# Patient Record
Sex: Female | Born: 1960 | Race: White | Hispanic: No | Marital: Married | Smoking: Never smoker
Health system: Southern US, Community
[De-identification: ages and names within clinical notes are randomized; demographics above are authoritative.]

## PROBLEM LIST (undated history)

## (undated) DIAGNOSIS — Z8619 Personal history of other infectious and parasitic diseases: Secondary | ICD-10-CM

## (undated) DIAGNOSIS — R7303 Prediabetes: Secondary | ICD-10-CM

## (undated) DIAGNOSIS — T7840XA Allergy, unspecified, initial encounter: Secondary | ICD-10-CM

## (undated) DIAGNOSIS — E079 Disorder of thyroid, unspecified: Secondary | ICD-10-CM

## (undated) DIAGNOSIS — I10 Essential (primary) hypertension: Secondary | ICD-10-CM

## (undated) HISTORY — PX: BREAST BIOPSY: SHX20

## (undated) HISTORY — DX: Prediabetes: R73.03

## (undated) HISTORY — DX: Disorder of thyroid, unspecified: E07.9

## (undated) HISTORY — DX: Personal history of other infectious and parasitic diseases: Z86.19

## (undated) HISTORY — DX: Allergy, unspecified, initial encounter: T78.40XA

## (undated) HISTORY — DX: Essential (primary) hypertension: I10

---

## 2003-05-04 HISTORY — PX: TOTAL ABDOMINAL HYSTERECTOMY: SHX209

## 2005-04-01 ENCOUNTER — Ambulatory Visit: Payer: Self-pay | Admitting: Unknown Physician Specialty

## 2005-04-20 ENCOUNTER — Ambulatory Visit: Payer: Self-pay | Admitting: Unknown Physician Specialty

## 2005-11-01 ENCOUNTER — Ambulatory Visit: Payer: Self-pay | Admitting: General Surgery

## 2006-04-14 ENCOUNTER — Ambulatory Visit: Payer: Self-pay | Admitting: Unknown Physician Specialty

## 2007-06-07 ENCOUNTER — Ambulatory Visit: Payer: Self-pay | Admitting: Unknown Physician Specialty

## 2007-06-12 ENCOUNTER — Ambulatory Visit: Payer: Self-pay | Admitting: Unknown Physician Specialty

## 2007-12-15 ENCOUNTER — Ambulatory Visit: Payer: Self-pay | Admitting: General Surgery

## 2008-06-18 ENCOUNTER — Ambulatory Visit: Payer: Self-pay | Admitting: General Surgery

## 2009-07-03 ENCOUNTER — Ambulatory Visit: Payer: Self-pay | Admitting: Unknown Physician Specialty

## 2010-07-08 ENCOUNTER — Ambulatory Visit: Payer: Self-pay | Admitting: Family Medicine

## 2011-07-13 ENCOUNTER — Ambulatory Visit: Payer: Self-pay | Admitting: Family Medicine

## 2012-03-29 ENCOUNTER — Ambulatory Visit: Payer: Self-pay | Admitting: General Practice

## 2012-07-25 ENCOUNTER — Ambulatory Visit: Payer: Self-pay | Admitting: Family Medicine

## 2013-08-08 ENCOUNTER — Ambulatory Visit: Payer: Self-pay | Admitting: Family Medicine

## 2013-08-28 ENCOUNTER — Ambulatory Visit: Payer: Self-pay | Admitting: Family Medicine

## 2014-07-26 LAB — BASIC METABOLIC PANEL
BUN: 10 mg/dL (ref 4–21)
CREATININE: 0.8 mg/dL (ref 0.5–1.1)
Potassium: 3.4 mmol/L (ref 3.4–5.3)
Sodium: 142 mmol/L (ref 137–147)

## 2014-07-26 LAB — HEPATIC FUNCTION PANEL
ALK PHOS: 16 U/L — AB (ref 25–125)
ALT: 28 U/L (ref 7–35)
AST: 24 U/L (ref 13–35)
BILIRUBIN, TOTAL: 0.5 mg/dL

## 2014-07-26 LAB — CBC AND DIFFERENTIAL
HEMATOCRIT: 42 % (ref 36–46)
Hemoglobin: 13.6 g/dL (ref 12.0–16.0)
NEUTROS ABS: 3 /uL
PLATELETS: 260 10*3/uL (ref 150–399)
WBC: 6 10*3/mL

## 2014-07-26 LAB — LIPID PANEL
Cholesterol: 156 mg/dL (ref 0–200)
HDL: 46 mg/dL (ref 35–70)
LDL CALC: 88 mg/dL
TRIGLYCERIDES: 112 mg/dL (ref 40–160)

## 2014-07-26 LAB — TSH: TSH: 3.69 u[IU]/mL (ref 0.41–5.90)

## 2014-07-26 LAB — HEMOGLOBIN A1C: HEMOGLOBIN A1C: 5.9

## 2014-09-23 ENCOUNTER — Other Ambulatory Visit: Payer: Self-pay | Admitting: Unknown Physician Specialty

## 2014-09-23 DIAGNOSIS — N63 Unspecified lump in unspecified breast: Secondary | ICD-10-CM

## 2014-10-02 ENCOUNTER — Ambulatory Visit
Admission: RE | Admit: 2014-10-02 | Discharge: 2014-10-02 | Disposition: A | Discharge status: Home / Self Care | Payer: BLUE CROSS/BLUE SHIELD | Source: Ambulatory Visit | Attending: Unknown Physician Specialty | Admitting: Unknown Physician Specialty

## 2014-10-02 DIAGNOSIS — N63 Unspecified lump in unspecified breast: Secondary | ICD-10-CM

## 2014-10-02 DIAGNOSIS — R922 Inconclusive mammogram: Secondary | ICD-10-CM | POA: Diagnosis not present

## 2015-05-20 ENCOUNTER — Ambulatory Visit: Payer: BLUE CROSS/BLUE SHIELD | Admitting: Nurse Practitioner

## 2015-05-22 ENCOUNTER — Ambulatory Visit (INDEPENDENT_AMBULATORY_CARE_PROVIDER_SITE_OTHER): Payer: BLUE CROSS/BLUE SHIELD | Admitting: Nurse Practitioner

## 2015-05-22 ENCOUNTER — Encounter: Payer: Self-pay | Admitting: Nurse Practitioner

## 2015-05-22 VITALS — BP 122/78 | HR 82 | Temp 98.9°F | Resp 16 | Ht 65.0 in | Wt 156.0 lb

## 2015-05-22 DIAGNOSIS — N952 Postmenopausal atrophic vaginitis: Secondary | ICD-10-CM

## 2015-05-22 DIAGNOSIS — Z7189 Other specified counseling: Secondary | ICD-10-CM | POA: Diagnosis not present

## 2015-05-22 DIAGNOSIS — Z7689 Persons encountering health services in other specified circumstances: Secondary | ICD-10-CM

## 2015-05-22 MED ORDER — ESTROGENS, CONJUGATED 0.625 MG/GM VA CREA: 1.0000 | TOPICAL_CREAM | VAGINAL | Status: DC

## 2015-05-22 NOTE — Progress Notes (Signed)
Patient ID: [name], female    DOB: Feb 15, 1961  Age: 55 y.o. MRN: [id number]  CC: No chief complaint on file.   HPI Latasha Lopez presents for establishing care and   1) New Pt Info:   Immunizations-  Flu UTD, unsure about last tdap   Mammogram- 10/02/2014   Pap- Hysterectomy   Colonoscopy- Needs this year  Will need mammogram in March at [facility]:  Need for refill on estrace- but would like to see if something else is cheaper.   Willing to try Premarin, has not tried others in past  Helpful for vaginal dryness during intercourse   History Latasha Lopez has a past medical history of Allergy; History of chicken pox; Hypertension; and Thyroid disease.   She has past surgical history that includes Breast biopsy (Left, [phone]) and Total abdominal hysterectomy (2005).   Her family history includes Breast cancer (age of onset: 79) in her mother; Diabetes in her paternal aunt; Fibroids in her mother and sister; Hypertension in her father and mother.She reports that she has never smoked. She does not have any smokeless tobacco history on file. She reports that she does not drink alcohol or use illicit drugs.  No outpatient prescriptions prior to visit.   No facility-administered medications prior to visit.    ROS Review of Systems  Constitutional: Negative for fever, chills, diaphoresis and fatigue.  Respiratory: Negative for chest tightness, shortness of breath and wheezing.   Cardiovascular: Negative for chest pain, palpitations and leg swelling.  Gastrointestinal: Negative for nausea, vomiting and diarrhea.  Skin: Negative for rash.  Neurological: Negative for dizziness, weakness, numbness and headaches.  Psychiatric/Behavioral: The patient is not nervous/anxious.    Objective:  BP 122/78 mmHg  Pulse 82  Temp(Src) 98.9 F (37.2 C)  Resp 16  Ht   Wt   SpO2 98%  Physical Exam  Constitutional: She is oriented to person, place, and time. She appears well-developed  and well-nourished. No distress.  HENT:  Head: Normocephalic and atraumatic.  Right Ear: External ear normal.  Left Ear: External ear normal.  Cardiovascular: Normal rate, regular rhythm and normal heart sounds.  Exam reveals no gallop and no friction rub.   No murmur heard. Pulmonary/Chest: Effort normal and breath sounds normal. No respiratory distress. She has no wheezes. She has no rales. She exhibits no tenderness.  Neurological: She is alert and oriented to person, place, and time. No cranial nerve deficit. She exhibits normal muscle tone. Coordination normal.  Skin: Skin is warm and dry. No rash noted. She is not diaphoretic.  Psychiatric: She has a normal mood and affect. Her behavior is normal. Judgment and thought content normal.   Assessment & Plan:   Diagnoses and all orders for this visit:  Encounter to establish care  Other orders -     conjugated estrogens (PREMARIN) vaginal cream; Place 1 Applicatorful vaginally once a week.   I am having Latasha Lopez start on conjugated estrogens. I am also having her maintain her hydrochlorothiazide, levothyroxine, estradiol, cholecalciferol, multivitamin with minerals, fexofenadine, and omega-3 acid ethyl esters.  Meds ordered this encounter  Medications  . hydrochlorothiazide (HYDRODIURIL) 25 MG tablet    Sig: Take 25 mg by mouth daily.  Marland Kitchen levothyroxine (SYNTHROID, LEVOTHROID) 25 MCG tablet    Sig: Take 25 mcg by mouth daily before breakfast.  . estradiol (ESTRACE) 0.1 MG/GM vaginal cream    Sig: Place 1 Applicatorful vaginally at bedtime.  . cholecalciferol (VITAMIN D) 1000 units tablet  Sig: Take 1,000 Units by mouth daily.  . Multiple Vitamins-Minerals (MULTIVITAMIN WITH MINERALS) tablet    Sig: Take 1 tablet by mouth daily.  . fexofenadine (ALLEGRA) 60 MG tablet    Sig: Take 60 mg by mouth 2 (two) times daily.  Marland Kitchen omega-3 acid ethyl esters (LOVAZA) 1 g capsule    Sig: Take by mouth 2 (two) times daily.  Marland Kitchen conjugated  estrogens (PREMARIN) vaginal cream    Sig: Place 1 Applicatorful vaginally once a week.    Dispense:  30 g    Refill:  1    Order Specific Question:  Supervising Provider    Answer:  Crecencio Mc [[id number]]     Follow-up: Return in about 2 months (around 07/20/2015) for CPE .

## 2015-05-22 NOTE — Progress Notes (Signed)
Pre visit review using our clinic review tool, if applicable. No additional management support is needed unless otherwise documented below in the visit note. 

## 2015-05-22 NOTE — Patient Instructions (Signed)
See you in March!   Welcome to [facility]!

## 2015-05-26 ENCOUNTER — Encounter: Payer: Self-pay | Admitting: Nurse Practitioner

## 2015-05-26 DIAGNOSIS — Z7689 Persons encountering health services in other specified circumstances: Secondary | ICD-10-CM | POA: Insufficient documentation

## 2015-05-26 NOTE — Assessment & Plan Note (Signed)
Discussed acute and chronic issues. Reviewed health maintenance measures, PFSHx, and immunizations. Obtain records from previous facilities- Dr. Ammie Dalton [facility]

## 2015-05-28 ENCOUNTER — Encounter: Payer: Self-pay | Admitting: Nurse Practitioner

## 2015-05-28 DIAGNOSIS — N952 Postmenopausal atrophic vaginitis: Secondary | ICD-10-CM | POA: Insufficient documentation

## 2015-05-28 NOTE — Assessment & Plan Note (Signed)
New Problem Pt was seen in past by Dr. Ammie Dalton and was using Estrace Finds it expensive Will try Premarin. Asked her to call if more expensive than Estrace Sent to pharmacy via e[facility] Will follow up in March

## 2015-05-30 ENCOUNTER — Encounter: Payer: Self-pay | Admitting: Nurse Practitioner

## 2015-06-26 ENCOUNTER — Encounter: Payer: Self-pay | Admitting: Nurse Practitioner

## 2015-06-26 ENCOUNTER — Other Ambulatory Visit: Payer: Self-pay

## 2015-06-26 MED ORDER — LEVOTHYROXINE SODIUM 25 MCG PO TABS: 25.0000 ug | ORAL_TABLET | Freq: Every day | ORAL | Status: DC

## 2015-06-26 MED ORDER — HYDROCHLOROTHIAZIDE 25 MG PO TABS: 25.0000 mg | ORAL_TABLET | Freq: Every day | ORAL | Status: DC

## 2015-06-26 MED ORDER — HYDROCHLOROTHIAZIDE 25 MG PO TABS
25.0000 mg | ORAL_TABLET | Freq: Every day | ORAL | Status: DC
Start: 2015-06-26 — End: 2015-06-26

## 2015-06-26 NOTE — Addendum Note (Signed)
Addended by: Bevelyn Ngo on: 06/26/2015 10:29 AM   Modules accepted: Orders

## 2015-07-28 ENCOUNTER — Encounter: Payer: Self-pay | Admitting: Nurse Practitioner

## 2015-08-04 ENCOUNTER — Encounter: Payer: Self-pay | Admitting: Nurse Practitioner

## 2015-08-05 LAB — HEPATIC FUNCTION PANEL
ALT: 19 U/L (ref 7–35)
AST: 22 U/L (ref 13–35)
Alkaline Phosphatase: 91 U/L (ref 25–125)
Bilirubin, Total: 0.4 mg/dL

## 2015-08-05 LAB — TSH: TSH: 2.37 u[IU]/mL (ref 0.41–5.90)

## 2015-08-05 LAB — BASIC METABOLIC PANEL
BUN: 13 mg/dL (ref 4–21)
Creatinine: 0.8 mg/dL (ref 0.5–1.1)
POTASSIUM: 3.6 mmol/L (ref 3.4–5.3)
Sodium: 140 mmol/L (ref 137–147)

## 2015-08-05 LAB — CBC AND DIFFERENTIAL
HEMATOCRIT: 39 % (ref 36–46)
HEMOGLOBIN: 12.8 g/dL (ref 12.0–16.0)
NEUTROS ABS: 50 /uL
PLATELETS: 291 10*3/uL (ref 150–399)
WBC: 5.5 10^3/mL

## 2015-08-05 LAB — LIPID PANEL
CHOLESTEROL: 136 mg/dL (ref 0–200)
HDL: 45 mg/dL (ref 35–70)
LDL Cholesterol: 71 mg/dL
Triglycerides: 98 mg/dL (ref 40–160)

## 2015-08-08 ENCOUNTER — Encounter: Payer: Self-pay | Admitting: Nurse Practitioner

## 2015-08-15 LAB — HEMOGLOBIN A1C: Hemoglobin A1C: 6.2

## 2015-08-15 LAB — HEPATIC FUNCTION PANEL
ALT: 19 U/L (ref 7–35)
AST: 22 U/L (ref 13–35)
Alkaline Phosphatase: 91 U/L (ref 25–125)
BILIRUBIN, TOTAL: 0.4 mg/dL

## 2015-08-15 LAB — CBC AND DIFFERENTIAL
HCT: 39 % (ref 36–46)
HEMOGLOBIN: 12.8 g/dL (ref 12.0–16.0)
Neutrophils Absolute: 3 /uL
Platelets: 291 10*3/uL (ref 150–399)
WBC: 5.5 10^3/mL

## 2015-08-15 LAB — BASIC METABOLIC PANEL
BUN: 13 mg/dL (ref 4–21)
CREATININE: 0.8 mg/dL (ref 0.5–1.1)
Glucose: 111 mg/dL
POTASSIUM: 3.6 mmol/L (ref 3.4–5.3)
SODIUM: 140 mmol/L (ref 137–147)

## 2015-08-15 LAB — LIPID PANEL
Cholesterol: 136 mg/dL (ref 0–200)
HDL: 45 mg/dL (ref 35–70)
LDL Cholesterol: 71 mg/dL
LDL/HDL RATIO: 3
TRIGLYCERIDES: 98 mg/dL (ref 40–160)

## 2015-08-15 LAB — TSH: TSH: 2.37 u[IU]/mL (ref 0.41–5.90)

## 2015-08-19 ENCOUNTER — Encounter: Payer: Self-pay | Admitting: Nurse Practitioner

## 2015-08-21 ENCOUNTER — Ambulatory Visit (INDEPENDENT_AMBULATORY_CARE_PROVIDER_SITE_OTHER): Payer: BLUE CROSS/BLUE SHIELD | Admitting: Nurse Practitioner

## 2015-08-21 ENCOUNTER — Encounter: Payer: Self-pay | Admitting: Nurse Practitioner

## 2015-08-21 VITALS — BP 137/81 | HR 77 | Temp 98.0°F | Ht 61.75 in | Wt 188.0 lb

## 2015-08-21 DIAGNOSIS — Z0000 Encounter for general adult medical examination without abnormal findings: Secondary | ICD-10-CM

## 2015-08-21 DIAGNOSIS — Z1239 Encounter for other screening for malignant neoplasm of breast: Secondary | ICD-10-CM | POA: Diagnosis not present

## 2015-08-21 MED ORDER — LEVOTHYROXINE SODIUM 25 MCG PO TABS: 25.0000 ug | ORAL_TABLET | Freq: Every day | ORAL | Status: DC

## 2015-08-21 MED ORDER — HYDROCHLOROTHIAZIDE 25 MG PO TABS: 25.0000 mg | ORAL_TABLET | Freq: Every day | ORAL | Status: DC

## 2015-08-21 NOTE — Progress Notes (Signed)
Pre-visit discussion using our clinic review tool. No additional management support is needed unless otherwise documented below in the visit note.  

## 2015-08-21 NOTE — Patient Instructions (Signed)

## 2015-08-21 NOTE — Progress Notes (Signed)
Patient ID: Latasha Lopez, female    DOB: 05-16-1960  Age: 55 y.o. MRN: [id number]  CC: Annual Exam   HPI Latasha Lopez presents for Annual Exam  1) Annual Physical   Diet- No formal   Exercise- No formal   Immunizations-    Tdap- Needs records from [place]- Ordered   Colonoscopy- Cologuard   Eye Exam- UTD, glasses  Dental Exam- UTD  LMP- Hysterectomy   Labs- Latasha Lopez   Depression- Negative   Refills: BP and thyroid    History Latasha Lopez has a past medical history of Allergy; History of chicken pox; Hypertension; and Thyroid disease.   She has past surgical history that includes Breast biopsy (Left, [phone]) and Total abdominal hysterectomy (2005).   Her family history includes Breast cancer (age of onset: 39) in her mother; Diabetes in her paternal aunt; Fibroids in her mother and sister; Hypertension in her father and mother.She reports that she has never smoked. She does not have any smokeless tobacco history on file. She reports that she does not drink alcohol or use illicit drugs.  Outpatient Prescriptions Prior to Visit  Medication Sig Dispense Refill  . cholecalciferol (VITAMIN D) 1000 units tablet Take 1,000 Units by mouth daily.    Marland Kitchen conjugated estrogens (PREMARIN) vaginal cream Place 1 Applicatorful vaginally once a week. 30 g 1  . fexofenadine (ALLEGRA) 60 MG tablet Take 60 mg by mouth 2 (two) times daily.    . Multiple Vitamins-Minerals (MULTIVITAMIN WITH MINERALS) tablet Take 1 tablet by mouth daily.    Marland Kitchen omega-3 acid ethyl esters (LOVAZA) 1 g capsule Take by mouth 2 (two) times daily.    . hydrochlorothiazide (HYDRODIURIL) 25 MG tablet Take 1 tablet (25 mg total) by mouth daily. 30 tablet 1  . levothyroxine (SYNTHROID, LEVOTHROID) 25 MCG tablet Take 1 tablet (25 mcg total) by mouth daily before breakfast. 30 tablet 1   No facility-administered medications prior to visit.    ROS Review of Systems  Constitutional: Negative for fever, chills, diaphoresis,  fatigue and unexpected weight change.  HENT: Negative for tinnitus and trouble swallowing.   Eyes: Negative for visual disturbance.  Respiratory: Negative for chest tightness, shortness of breath and wheezing.   Cardiovascular: Negative for chest pain, palpitations and leg swelling.  Gastrointestinal: Negative for nausea, vomiting, abdominal pain, diarrhea, constipation and blood in stool.  Endocrine: Negative for polydipsia, polyphagia and polyuria.  Genitourinary: Negative for dysuria, hematuria, vaginal discharge and vaginal pain.  Musculoskeletal: Negative for myalgias, back pain, arthralgias and gait problem.  Skin: Negative for color change and rash.  Neurological: Negative for dizziness, weakness, numbness and headaches.  Hematological: Does not bruise/bleed easily.  Psychiatric/Behavioral: Negative for suicidal ideas and sleep disturbance. The patient is not nervous/anxious.     Objective:  BP 137/81 mmHg  Pulse 77  Temp(Src) 98 F (36.7 C) (Oral)  Ht 5' 1.75" (1.568 m)  Wt 188 lb (85.276 kg)  BMI 34.68 kg/m2  SpO2 96%  Physical Exam  Constitutional: She is oriented to person, place, and time. She appears well-developed and well-nourished. No distress.  HENT:  Head: Normocephalic and atraumatic.  Right Ear: External ear normal.  Left Ear: External ear normal.  Nose: Nose normal.  Mouth/Throat: Oropharynx is clear and moist. No oropharyngeal exudate.  TMs and canals clear bilaterally  Eyes: Conjunctivae and EOM are normal. Pupils are equal, round, and reactive to light. Right eye exhibits no discharge. Left eye exhibits no discharge. No scleral icterus.  Neck: Normal  range of motion. Neck supple. No thyromegaly present.  Cardiovascular: Normal rate, regular rhythm, normal heart sounds and intact distal pulses.  Exam reveals no gallop and no friction rub.   No murmur heard. Pulmonary/Chest: Effort normal and breath sounds normal. No respiratory distress. She has no  wheezes. She has no rales. She exhibits no tenderness.  Clinical breast exam without findings today  Abdominal: Soft. Bowel sounds are normal. She exhibits no distension and no mass. There is no tenderness. There is no rebound and no guarding.  Genitourinary:  Deferred due to lack of concern and hysterectomy history  Musculoskeletal: Normal range of motion. She exhibits no edema or tenderness.  Lymphadenopathy:    She has no cervical adenopathy.  Neurological: She is alert and oriented to person, place, and time. She has normal reflexes. No cranial nerve deficit. She exhibits normal muscle tone. Coordination normal.  Skin: Skin is warm and dry. No rash noted. She is not diaphoretic. No erythema. No pallor.  Psychiatric: She has a normal mood and affect. Her behavior is normal. Judgment and thought content normal.   Assessment & Plan:   Olie was seen today for annual exam.  Diagnoses and all orders for this visit:  Screening breast examination -     MM DIGITAL SCREENING BILATERAL; Future  Routine general medical examination at a health care facility  Other orders -     hydrochlorothiazide (HYDRODIURIL) 25 MG tablet; Take 1 tablet (25 mg total) by mouth daily. -     levothyroxine (SYNTHROID, LEVOTHROID) 25 MCG tablet; Take 1 tablet (25 mcg total) by mouth daily before breakfast.   I am having Latasha Lopez maintain her cholecalciferol, multivitamin with minerals, fexofenadine, omega-3 acid ethyl esters, conjugated estrogens, hydrochlorothiazide, and levothyroxine.  Meds ordered this encounter  Medications  . hydrochlorothiazide (HYDRODIURIL) 25 MG tablet    Sig: Take 1 tablet (25 mg total) by mouth daily.    Dispense:  90 tablet    Refill:  3    Order Specific Question:  Supervising Provider    Answer:  Deborra Medina L [[id number]]  . levothyroxine (SYNTHROID, LEVOTHROID) 25 MCG tablet    Sig: Take 1 tablet (25 mcg total) by mouth daily before breakfast.    Dispense:  90 tablet     Refill:  3    Order Specific Question:  Supervising Provider    Answer:  Crecencio Mc [[id number]]     Follow-up: Return if symptoms worsen or fail to improve.

## 2015-08-22 ENCOUNTER — Encounter: Payer: Self-pay | Admitting: Nurse Practitioner

## 2015-08-28 ENCOUNTER — Other Ambulatory Visit: Payer: Self-pay | Admitting: Family Medicine

## 2015-08-31 DIAGNOSIS — Z1239 Encounter for other screening for malignant neoplasm of breast: Secondary | ICD-10-CM | POA: Insufficient documentation

## 2015-08-31 DIAGNOSIS — Z0000 Encounter for general adult medical examination without abnormal findings: Secondary | ICD-10-CM | POA: Insufficient documentation

## 2015-08-31 NOTE — Assessment & Plan Note (Addendum)
Discussed acute and chronic issues. Reviewed health maintenance measures, PFSHx, and immunizations.   Labs through [facility] ordered HM made UTD Ordered cologuard

## 2015-08-31 NOTE — Assessment & Plan Note (Signed)
Ordered mammogram.

## 2015-10-03 ENCOUNTER — Other Ambulatory Visit: Payer: Self-pay | Admitting: Nurse Practitioner

## 2015-10-03 ENCOUNTER — Ambulatory Visit
Admission: RE | Admit: 2015-10-03 | Discharge: 2015-10-03 | Disposition: A | Discharge status: Home / Self Care | Payer: BLUE CROSS/BLUE SHIELD | Source: Ambulatory Visit | Attending: Nurse Practitioner | Admitting: Nurse Practitioner

## 2015-10-03 ENCOUNTER — Encounter: Payer: Self-pay | Admitting: Nurse Practitioner

## 2015-10-03 DIAGNOSIS — Z1239 Encounter for other screening for malignant neoplasm of breast: Secondary | ICD-10-CM

## 2015-10-03 DIAGNOSIS — Z1231 Encounter for screening mammogram for malignant neoplasm of breast: Secondary | ICD-10-CM | POA: Insufficient documentation

## 2016-09-15 ENCOUNTER — Other Ambulatory Visit: Payer: Self-pay | Admitting: Nurse Practitioner

## 2016-10-25 ENCOUNTER — Other Ambulatory Visit: Payer: Self-pay | Admitting: Family Medicine

## 2016-12-10 ENCOUNTER — Other Ambulatory Visit: Payer: Self-pay

## 2016-12-10 ENCOUNTER — Telehealth: Payer: Self-pay

## 2016-12-10 DIAGNOSIS — R319 Hematuria, unspecified: Secondary | ICD-10-CM

## 2016-12-10 DIAGNOSIS — Z0000 Encounter for general adult medical examination without abnormal findings: Secondary | ICD-10-CM

## 2016-12-10 LAB — POCT URINALYSIS DIPSTICK
Bilirubin, UA: NEGATIVE
GLUCOSE UA: NEGATIVE
Ketones, UA: NEGATIVE
NITRITE UA: NEGATIVE
Spec Grav, UA: 1.02 (ref 1.010–1.025)
UROBILINOGEN UA: 0.2 U/dL
pH, UA: 6 (ref 5.0–8.0)

## 2016-12-10 NOTE — Addendum Note (Signed)
Addended by: Maura Crandall on: 12/10/2016 02:26 PM   Modules accepted: Orders

## 2016-12-10 NOTE — Telephone Encounter (Signed)
Pt urine dip shows large blood. Called pt; she reports she is not on her menses. She will return today to provide a sample to be sent for c&s.

## 2016-12-11 LAB — CMP12+LP+TP+TSH+6AC+CBC/D/PLT
ALK PHOS: 84 IU/L (ref 39–117)
ALT: 28 IU/L (ref 0–32)
AST: 28 IU/L (ref 0–40)
Albumin/Globulin Ratio: 1.9 (ref 1.2–2.2)
Albumin: 4.4 g/dL (ref 3.5–5.5)
BASOS ABS: 0.1 10*3/uL (ref 0.0–0.2)
BILIRUBIN TOTAL: 0.6 mg/dL (ref 0.0–1.2)
BUN / CREAT RATIO: 14 (ref 9–23)
BUN: 13 mg/dL (ref 6–24)
Basos: 1 %
CHOL/HDL RATIO: 4 ratio (ref 0.0–4.4)
CREATININE: 0.91 mg/dL (ref 0.57–1.00)
Calcium: 9.3 mg/dL (ref 8.7–10.2)
Chloride: 101 mmol/L (ref 96–106)
Cholesterol, Total: 155 mg/dL (ref 100–199)
EOS (ABSOLUTE): 0.2 10*3/uL (ref 0.0–0.4)
EOS: 3 %
Estimated CHD Risk: 0.8 times avg. (ref 0.0–1.0)
Free Thyroxine Index: 2 (ref 1.2–4.9)
GFR calc non Af Amer: 70 mL/min/{1.73_m2} (ref >59)
GFR, EST AFRICAN AMERICAN: 81 mL/min/{1.73_m2} (ref >59)
GGT: 16 IU/L (ref 0–60)
GLOBULIN, TOTAL: 2.3 g/dL (ref 1.5–4.5)
GLUCOSE: 104 mg/dL — AB (ref 65–99)
HDL: 39 mg/dL — AB (ref >39)
HEMOGLOBIN: 14 g/dL (ref 11.1–15.9)
Hematocrit: 41.1 % (ref 34.0–46.6)
IMMATURE GRANS (ABS): 0 10*3/uL (ref 0.0–0.1)
Immature Granulocytes: 0 %
Iron: 109 ug/dL (ref 27–159)
LDH: 193 IU/L (ref 119–226)
LDL Calculated: 84 mg/dL (ref 0–99)
LYMPHS: 33 %
Lymphocytes Absolute: 1.8 10*3/uL (ref 0.7–3.1)
MCH: 27.6 pg (ref 26.6–33.0)
MCHC: 34.1 g/dL (ref 31.5–35.7)
MCV: 81 fL (ref 79–97)
MONOCYTES: 10 %
Monocytes Absolute: 0.5 10*3/uL (ref 0.1–0.9)
Neutrophils Absolute: 2.9 10*3/uL (ref 1.4–7.0)
Neutrophils: 53 %
PLATELETS: 252 10*3/uL (ref 150–379)
Phosphorus: 4.8 mg/dL — ABNORMAL HIGH (ref 2.5–4.5)
Potassium: 3.8 mmol/L (ref 3.5–5.2)
RBC: 5.07 x10E6/uL (ref 3.77–5.28)
RDW: 14.1 % (ref 12.3–15.4)
Sodium: 144 mmol/L (ref 134–144)
T3 UPTAKE RATIO: 26 % (ref 24–39)
T4 TOTAL: 7.6 ug/dL (ref 4.5–12.0)
TRIGLYCERIDES: 161 mg/dL — AB (ref 0–149)
TSH: 3.33 u[IU]/mL (ref 0.450–4.500)
Total Protein: 6.7 g/dL (ref 6.0–8.5)
URIC ACID: 6.9 mg/dL (ref 2.5–7.1)
VLDL CHOLESTEROL CAL: 32 mg/dL (ref 5–40)
WBC: 5.4 10*3/uL (ref 3.4–10.8)

## 2016-12-11 LAB — VITAMIN D 25 HYDROXY (VIT D DEFICIENCY, FRACTURES): VIT D 25 HYDROXY: 46.1 ng/mL (ref 30.0–100.0)

## 2016-12-12 LAB — URINE CULTURE

## 2016-12-16 ENCOUNTER — Ambulatory Visit: Payer: Self-pay | Admitting: Adult Health

## 2016-12-17 ENCOUNTER — Encounter: Payer: Self-pay | Admitting: Adult Health

## 2016-12-17 ENCOUNTER — Ambulatory Visit: Payer: Self-pay | Admitting: Adult Health

## 2016-12-17 VITALS — BP 122/86 | HR 72 | Temp 98.3°F | Ht 62.2 in | Wt 191.6 lb

## 2016-12-17 DIAGNOSIS — N6459 Other signs and symptoms in breast: Secondary | ICD-10-CM

## 2016-12-17 DIAGNOSIS — R319 Hematuria, unspecified: Secondary | ICD-10-CM

## 2016-12-17 DIAGNOSIS — R82998 Other abnormal findings in urine: Secondary | ICD-10-CM

## 2016-12-17 DIAGNOSIS — Z90710 Acquired absence of both cervix and uterus: Secondary | ICD-10-CM | POA: Insufficient documentation

## 2016-12-17 DIAGNOSIS — Z0000 Encounter for general adult medical examination without abnormal findings: Secondary | ICD-10-CM

## 2016-12-17 DIAGNOSIS — R7309 Other abnormal glucose: Secondary | ICD-10-CM

## 2016-12-17 LAB — POCT URINALYSIS DIPSTICK
BILIRUBIN UA: NEGATIVE
Glucose, UA: NEGATIVE
Ketones, UA: NEGATIVE
Nitrite, UA: NEGATIVE
PH UA: 6 (ref 5.0–8.0)
Protein, UA: NEGATIVE
SPEC GRAV UA: 1.01 (ref 1.010–1.025)
UROBILINOGEN UA: 0.2 U/dL

## 2016-12-17 MED ORDER — LEVOTHYROXINE SODIUM 25 MCG PO TABS: 25.0000 ug | ORAL_TABLET | Freq: Every day | ORAL | 2 refills | Status: DC

## 2016-12-17 MED ORDER — NYSTATIN 100000 UNIT/GM EX CREA: 1.0000 "application " | TOPICAL_CREAM | Freq: Two times a day (BID) | CUTANEOUS | 0 refills | Status: DC

## 2016-12-17 MED ORDER — HYDROCHLOROTHIAZIDE 25 MG PO TABS: 25.0000 mg | ORAL_TABLET | Freq: Every day | ORAL | 2 refills | Status: DC

## 2016-12-17 NOTE — Progress Notes (Addendum)
Subjective:     Patient ID: Latasha Lopez, female   DOB: 1960-10-28, 56 y.o.   MRN: [id number]  HPI Patient is a 56 year old caucasian female who presents to the clinic for a wellness exam to establish care. She currently denies any complaints at this visit.  Her urine dipstick showed trace blood when routine lab work for physical was done, and urine was cultured not showing infection. Today's urine dipstick showed trace hemolyzed blood. Durward Mallard Med Tech reports urine is cloudy as well. She denies any pain with urination, change in urinary stream or frequency at any time. She denies any other associated symptoms.  She sees her dentist regularly.   Results for orders placed or performed in visit on 12/17/16 (from the past 48 hour(s))  POCT urinalysis dipstick     Status: Abnormal   Collection Time: 12/17/16 11:24 AM  Result Value Ref Range   Color, UA yellow    Clarity, UA cloudy    Glucose, UA neg    Bilirubin, UA neg    Ketones, UA neg    Spec Grav, UA 1.010 1.010 - 1.025   Blood, UA hemolyzed trace    pH, UA 6.0 5.0 - 8.0   Protein, UA neg    Urobilinogen, UA 0.2 0.2 or 1.0 E.U./dL   Nitrite, UA neg    Leukocytes, UA Large (3+) (A) Negative     Colonoscopy never has had- she scheduled but canceled due to insurance. She reports she would like a referral for   Winter term and she would like to schedule.   She reports last Mammogram 2 years ago and she would like to have one in winter term- will send order.   She does not exercise regularly.    Blood pressure 122/86, pulse 72, temperature 98.3 F (36.8 C), height 5' 2.2" (1.58 m), weight 191 lb 9.6 oz (86.9 kg), SpO2 96%   Recheck B/P 122/78   Family History  Problem Relation Age of Onset  . Breast cancer Mother 68  . Hypertension Mother   . Fibroids Mother   . Hypertension Father   . Fibroids Sister   . Diabetes Paternal Aunt     Last gynecology exam  visit not since 3 years ago  As she was with Dr.Vandel until he  retired  Request female provider at [facility]. She wants to  Schedule  as well.  Last Eye exam [facility] - she sees regularly and does wear prescription eye glasses. Last Dermatologist  She does not see a dermatologist and denies any changing  Skin.  Social History  Substance Use Topics  . Smoking status: Never Smoker  . Smokeless tobacco: Never Used  . Alcohol use No   She denies any other signs or symptoms at this time.   Review of Systems  Constitutional: Negative.  Negative for activity change, appetite change, chills, diaphoresis (hot flashes at night in summer), fatigue, fever and unexpected weight change.  HENT: Negative for congestion, dental problem, drooling, ear discharge, ear pain, facial swelling, hearing loss, mouth sores, nosebleeds, postnasal drip, rhinorrhea, sinus pain, sinus pressure, sneezing, sore throat, tinnitus, trouble swallowing and voice change.   Eyes: Negative for photophobia, pain, discharge, redness, itching and visual disturbance.  Respiratory: Negative for apnea, cough, choking, chest tightness, shortness of breath, wheezing and stridor.   Cardiovascular: Negative for chest pain, palpitations and leg swelling.  Gastrointestinal: Negative for abdominal distention, abdominal pain, anal bleeding, blood in stool, constipation, diarrhea,  nausea, rectal pain and vomiting.  Endocrine: Negative for cold intolerance, heat intolerance, polydipsia, polyphagia and polyuria.  Genitourinary: Positive for hematuria (found incidentially with urine dipstick/ she denies seeing any). Negative for decreased urine volume, difficulty urinating, dyspareunia, dysuria, enuresis, flank pain, frequency, genital sores, menstrual problem, pelvic pain, urgency, vaginal bleeding, vaginal discharge and vaginal pain.  Musculoskeletal: Negative for arthralgias, back pain, gait problem, joint swelling, myalgias, neck pain and neck stiffness.  Skin: Negative for color change,  pallor, rash and wound.  Allergic/Immunologic: Negative for environmental allergies, food allergies and immunocompromised state.  Neurological: Negative for dizziness, tremors, seizures, syncope, facial asymmetry, speech difficulty, weakness, light-headedness (occasionally with quick movemnets but does not happen often only with sinus medications.), numbness and headaches.  Hematological: Negative for adenopathy. Does not bruise/bleed easily.  Psychiatric/Behavioral: Negative for agitation, behavioral problems, confusion, decreased concentration, dysphoric mood, hallucinations, self-injury, sleep disturbance and suicidal ideas. The patient is not nervous/anxious and is not hyperactive.    Current Meds  Medication Sig  . Biotin 1 MG CAPS Take by mouth.  . cholecalciferol (VITAMIN D) 1000 units tablet Take 1,000 Units by mouth daily.  Marland Kitchen CINNAMON PO Take by mouth.  . conjugated estrogens (PREMARIN) vaginal cream Place 1 Applicatorful vaginally once a week.  . fexofenadine (ALLEGRA) 60 MG tablet Take 60 mg by mouth 2 (two) times daily.  . hydrochlorothiazide (HYDRODIURIL) 25 MG tablet TAKE 1 TABLET (25 MG TOTAL) BY MOUTH DAILY.  Marland Kitchen levothyroxine (SYNTHROID, LEVOTHROID) 25 MCG tablet TAKE 1 TABLET (25 MCG TOTAL) BY MOUTH DAILY BEFORE BREAKFAST.  Marland Kitchen vitamin B-12 (CYANOCOBALAMIN) 50 MCG tablet Take by mouth daily.     She requests refills on blood pressure and thyroid medication. She requests  Premarin, though no vaginal exams/pelvics in office- advised patient she needs to see gynecology for ongoing evaluation/ exam with Premarin and per guidelines.  Objective:   Physical Exam  Constitutional: She is oriented to person, place, and time. She appears well-developed and well-nourished. She is active. No distress. She is not intubated.  HENT:  Head: Normocephalic and atraumatic.  Right Ear: External ear normal.  Left Ear: External ear normal.  Nose: Nose normal.  Mouth/Throat: No oropharyngeal  exudate.  Eyes: Pupils are equal, round, and reactive to light. Conjunctivae, EOM and lids are normal. Right eye exhibits no discharge and no exudate. Left eye exhibits no discharge and no exudate. Right conjunctiva is not injected. Right conjunctiva has no hemorrhage. Left conjunctiva is not injected. Left conjunctiva has no hemorrhage. No scleral icterus.  Neck: Trachea normal, normal range of motion, full passive range of motion without pain and phonation normal. Neck supple. Normal carotid pulses, no hepatojugular reflux and no JVD present. No tracheal tenderness, no spinous process tenderness and no muscular tenderness present. Carotid bruit is not present. No neck rigidity. No tracheal deviation, no edema, no erythema and normal range of motion present. No Brudzinski's sign and no Kernig's sign noted. No thyroid mass and no thyromegaly present.  Cardiovascular: Normal rate, regular rhythm, S1 normal, S2 normal, normal heart sounds, intact distal pulses and normal pulses.  Exam reveals no gallop, no distant heart sounds and no friction rub.   No murmur heard. Pulses:      Radial pulses are 2+ on the right side, and 2+ on the left side.       Dorsalis pedis pulses are 2+ on the right side, and 2+ on the left side.       Posterior tibial pulses are 2+ on  the right side, and 2+ on the left side.  Pulmonary/Chest: Effort normal and breath sounds normal. No accessory muscle usage or stridor. No apnea, no tachypnea and no bradypnea. She is not intubated. No respiratory distress. She has no wheezes. She has no rales. She exhibits no tenderness.  Abdominal: Soft. Normal aorta and bowel sounds are normal. She exhibits no shifting dullness, no distension, no pulsatile liver, no fluid wave, no abdominal bruit, no ascites, no pulsatile midline mass and no mass. There is no hepatosplenomegaly, splenomegaly or hepatomegaly. There is generalized tenderness. There is no rebound, no guarding and no CVA tenderness. No  hernia.  Genitourinary:  Genitourinary Comments: No pelvic exam set up in this office patient aware needs to see Gynecology yearly.   Musculoskeletal: Normal range of motion. She exhibits no edema, tenderness or deformity.  Lymphadenopathy:       Head (right side): No submental, no submandibular, no tonsillar, no preauricular, no posterior auricular and no occipital adenopathy present.       Head (left side): No submental, no submandibular, no tonsillar, no preauricular, no posterior auricular and no occipital adenopathy present.    She has no cervical adenopathy.       Right cervical: No superficial cervical, no deep cervical and no posterior cervical adenopathy present.      Left cervical: No superficial cervical, no deep cervical and no posterior cervical adenopathy present.    She has no axillary adenopathy.       Right axillary: No pectoral and no lateral adenopathy present.       Left axillary: No pectoral and no lateral adenopathy present.      Right: No supraclavicular adenopathy present.       Left: No supraclavicular adenopathy present.  Neurological: She is alert and oriented to person, place, and time. She has normal strength and normal reflexes. She displays no atrophy, no tremor and normal reflexes. No cranial nerve deficit or sensory deficit. She exhibits normal muscle tone. She displays a negative Romberg sign. She displays no seizure activity. Coordination and gait normal. GCS eye subscore is 4. GCS verbal subscore is 5. GCS motor subscore is 6. She displays no Babinski's sign on the right side. She displays no Babinski's sign on the left side.  Reflex Scores:      Tricep reflexes are 2+ on the right side and 2+ on the left side.      Bicep reflexes are 2+ on the right side and 2+ on the left side.      Brachioradialis reflexes are 2+ on the right side and 2+ on the left side.      Patellar reflexes are 2+ on the right side and 2+ on the left side.      Achilles reflexes are 2+  on the right side and 2+ on the left side. Patient makes eye contact and follows provider with eyes. Patient moves on and off exam table without difficulty.Gait is sure and steady in room and with ambulation in hall. Speech is clear and concise.  Skin: Skin is warm, dry and intact. No abrasion, no bruising, no burn, no ecchymosis, no laceration, no lesion, no petechiae and no rash noted. She is not diaphoretic. There is erythema (mild pink area not raised coinsistent with yeast under left breast fold/ no drainage or warmth ). No cyanosis. No pallor. Nails show no clubbing.  Psychiatric: She has a normal mood and affect. Her speech is normal and behavior is normal. Judgment and thought content  normal. Cognition and memory are normal.  Patient engages in conversation without any difficulty. Pleasant mood.   Nursing note and vitals reviewed. Breasts: Right with approximately 2cm x 2 cm nodular area palpated, non tender, fixed at approximately 3 o'clock. Previous scar from previous biopsy per patient 2012 at 27' o' clock.  Left breast with small multiple nodular areas approximately 5 o' clock. Exam is limited due to bilateral large breast with right larger than left.  She denies any tenderness.  Bilaterally patient denies any  nipple changes or axillary nodes- non palpated.     Recent Results (from the past 2160 hour(s))  VITAMIN D 25 Hydroxy (Vit-D Deficiency, Fractures)     Status: None   Collection Time: 12/10/16  8:04 AM  Result Value Ref Range   Vit D, 25-Hydroxy 46.1 30.0 - 100.0 ng/mL    Comment: Vitamin D deficiency has been defined by the Steen practice guideline as a level of serum 25-OH vitamin D less than 20 ng/mL (1,2). The Endocrine Society went on to further define vitamin D insufficiency as a level between 21 and 29 ng/mL (2). 1. IOM (Institute of Medicine). 2010. Dietary reference    intakes for calcium and D. [place]: The    [employer]. 2. Holick MF, Binkley Waverly, Bischoff-Ferrari HA, et al.    Evaluation, treatment, and prevention of vitamin D    deficiency: an Endocrine Society clinical practice    guideline. JCEM. 2011 Jul; 96(7):1911-30.   CMP12+LP+TP+TSH+6AC+CBC/D/Plt     Status: Abnormal   Collection Time: 12/10/16  8:04 AM  Result Value Ref Range   Glucose 104 (H) 65 - 99 mg/dL    Comment: Specimen received in contact with cells. No visible hemolysis present. However GLUC may be decreased and K increased. Clinical correlation indicated.    Uric Acid 6.9 2.5 - 7.1 mg/dL    Comment:            Therapeutic target for gout patients: <6.0   BUN 13 6 - 24 mg/dL   Creatinine, Ser 0.91 0.57 - 1.00 mg/dL   GFR calc non Af Amer 70 >59 mL/min/1.73   GFR calc Af Amer 81 >59 mL/min/1.73   BUN/Creatinine Ratio 14 9 - 23   Sodium 144 134 - 144 mmol/L   Potassium 3.8 3.5 - 5.2 mmol/L    Comment: Specimen received in contact with cells. No visible hemolysis present. However GLUC may be decreased and K increased. Clinical correlation indicated.    Chloride 101 96 - 106 mmol/L   Calcium 9.3 8.7 - 10.2 mg/dL   Phosphorus 4.8 (H) 2.5 - 4.5 mg/dL   Total Protein 6.7 6.0 - 8.5 g/dL   Albumin 4.4 3.5 - 5.5 g/dL   Globulin, Total 2.3 1.5 - 4.5 g/dL   Albumin/Globulin Ratio 1.9 1.2 - 2.2   Bilirubin Total 0.6 0.0 - 1.2 mg/dL   Alkaline Phosphatase 84 39 - 117 IU/L   LDH 193 119 - 226 IU/L   AST 28 0 - 40 IU/L   ALT 28 0 - 32 IU/L   GGT 16 0 - 60 IU/L   Iron 109 27 - 159 ug/dL   Cholesterol, Total 155 100 - 199 mg/dL   Triglycerides 161 (H) 0 - 149 mg/dL   HDL 39 (L) >39 mg/dL   VLDL Cholesterol Cal 32 5 - 40 mg/dL   LDL Calculated 84 0 - 99 mg/dL   Chol/HDL Ratio 4.0 0.0 -  4.4 ratio    Comment:                                   T. Chol/HDL Ratio                                             Men  Women                               1/2 Avg.Risk  3.4    3.3                                   Avg.Risk  5.0     4.4                                2X Avg.Risk  9.6    7.1                                3X Avg.Risk 23.4   11.0    Estimated CHD Risk 0.8 0.0 - 1.0 times avg.    Comment:                                   T. Chol/HDL Ratio                                             Men  Women                               1/2 Avg.Risk  3.4    3.3                                   Avg.Risk  5.0    4.4                                2X Avg.Risk  9.6    7.1                                3X Avg.Risk 23.4   11.0 The CHD Risk is based on the T. Chol/HDL ratio.  Other factors affect CHD Risk such as hypertension, smoking, diabetes, severe obesity, and family history of pre- mature CHD.    TSH 3.330 0.450 - 4.500 uIU/mL   T4, Total 7.6 4.5 - 12.0 ug/dL   T3 Uptake Ratio 26 24 - 39 %   Free Thyroxine Index 2.0 1.2 - 4.9   WBC 5.4 3.4 - 10.8 x10E3/uL   RBC 5.07 3.77 - 5.28 x10E6/uL   Hemoglobin 14.0 11.1 - 15.9 g/dL   Hematocrit 41.1 34.0 - 46.6 %  MCV 81 79 - 97 fL   MCH 27.6 26.6 - 33.0 pg   MCHC 34.1 31.5 - 35.7 g/dL   RDW 14.1 12.3 - 15.4 %   Platelets 252 150 - 379 x10E3/uL   Neutrophils 53 Not Estab. %   Lymphs 33 Not Estab. %   Monocytes 10 Not Estab. %   Eos 3 Not Estab. %   Basos 1 Not Estab. %   Neutrophils Absolute 2.9 1.4 - 7.0 x10E3/uL   Lymphocytes Absolute 1.8 0.7 - 3.1 x10E3/uL   Monocytes Absolute 0.5 0.1 - 0.9 x10E3/uL   EOS (ABSOLUTE) 0.2 0.0 - 0.4 x10E3/uL   Basophils Absolute 0.1 0.0 - 0.2 x10E3/uL   Immature Granulocytes 0 Not Estab. %   Immature Grans (Abs) 0.0 0.0 - 0.1 x10E3/uL  POCT urinalysis dipstick     Status: Abnormal   Collection Time: 12/10/16  8:37 AM  Result Value Ref Range   Color, UA YELLOW    Clarity, UA CLEAR    Glucose, UA NEG    Bilirubin, UA NEG    Ketones, UA NEG    Spec Grav, UA 1.020 1.010 - 1.025   Blood, UA TR    pH, UA 6.0 5.0 - 8.0   Protein, UA TR    Urobilinogen, UA 0.2 0.2 or 1.0 E.U./dL   Nitrite, UA NEG    Leukocytes, UA Moderate  (2+) (A) Negative  Urine Culture     Status: None   Collection Time: 12/10/16  2:26 PM  Result Value Ref Range   Urine Culture, Routine Final report    Organism ID, Bacteria Comment     Comment: Mixed urogenital flora Less than 10,000 colonies/mL   POCT urinalysis dipstick     Status: Abnormal   Collection Time: 12/17/16 11:24 AM  Result Value Ref Range   Color, UA yellow    Clarity, UA cloudy    Glucose, UA neg    Bilirubin, UA neg    Ketones, UA neg    Spec Grav, UA 1.010 1.010 - 1.025   Blood, UA hemolyzed trace    pH, UA 6.0 5.0 - 8.0   Protein, UA neg    Urobilinogen, UA 0.2 0.2 or 1.0 E.U./dL   Nitrite, UA neg    Leukocytes, UA Large (3+) (A) Negative   Assessment/ Plan      1. History of abnormal mammograms in past with abnormal breast exam in office today will refer for bilateral diagnostic mammogram and ultrasound. She has a family history of breast cancer in her mother. 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     2. Will refer for colonsocopy and gynecology. Patient prefers [facility].   3. Glucose mildly elevated, she reports she was fasting. Elevated A1C in past per her report. Will check A1C. Discussed diest changes. Will order A1C   Will refill medications except for Premarin she will see GYN for evaluation.   4. Rechecked PCOT Urine trace hematuria today, will send for culture and call patient with results. She also had positive blood on dipstick in office for labs in August prior to physical.  Will wait on culture, treat if infection and repeat urine PCOT in 2 weeks. Will refer to Urology if hematuria persists.   5. Tryglycerides mildly elevated discussed causes and diet changed. She will make diet changes and recheck fasting 6  months. Discussed ways to increase HDL. Discussed elevated glucose and need for diet changes and evaluation      6. Very mild Intertrigo under left breast with appearance of yeast. Will give Nystatin cream. Return if worsens at anytime.   She will return for fasting Hemoglobin A 1 C at anytime- order placed.   She will return for recheck full executive panel in 6 months.   Filed Weights   12/17/16 1106 12/17/16 1114  Weight: 191 lb 3.2 oz (86.7 kg) 191 lb 9.6 oz (86.9 kg)   Body mass index is 34.82 kg/m.  Diet and exercise discussed and healthy lifestyle.   Urine culture was sent 12/17/16 If no infection will need referral to UROLOGY. Patient is aware and will also be monitoring for any hematuria. Handouts given. She will contact the office for any signs of infection.    Patient was explained all of the above. She will call the office with any questions or concerns. She will follow up with the office as needed. She will seek emergency care in the emerhency room or urgent care should office be closed or any emergency call 911. Patient verbalized understanding of all information given and has no further questions at this time.     1. Routine health maintenance  - Ambulatory referral to Gastroenterology - Ambulatory referral to Obstetrics / Gynecology  2. Leukocytes in urine  - POCT urinalysis dipstick - Urine Culture  3. H/O total hysterectomy  - Ambulatory referral to Obstetrics / Gynecology Has not seen GYN in 3 years/ wants hormone therapy refills. She is only on PREMARIN and needs evaluation by GYN. Also incidental hematuria being worked up. 4. Hematuria, unspecified type  - Urine Culture - Ambulatory referral to Obstetrics / Gynecology  5. Abnormal breast finding  - MM DIAG BREAST TOMO BILATERAL; Future - US BREAST LTD UNI LEFT INC AXILLA; Future - US BREAST LTD UNI RIGHT INC AXILLA; Future  6. Elevated glucose  - Hemoglobin A1c; Future  Over the counter medications were not refilled as below. Only Nystatin, Hydrochlorothiazide, and Synthroid were refilled. She needs  labs rechecked full executive female panel in 6 months approximately 06/2017. Patient is aware and verbalized understanding.   Meds ordered this encounter  Medications  . Biotin 1 MG CAPS    Sig: Take by mouth.  Marland Kitchen CINNAMON PO    Sig: Take by mouth.  . vitamin B-12 (CYANOCOBALAMIN) 50 MCG tablet    Sig: Take by mouth daily.  Marland Kitchen nystatin cream (MYCOSTATIN)    Sig: Apply 1 application topically 2 (two) times daily. Under breast folds if itchy/ pink as needed.    Dispense:  30 g    Refill:  0  . levothyroxine (SYNTHROID, LEVOTHROID) 25 MCG tablet    Sig: Take 1 tablet (25 mcg total) by mouth daily before breakfast.    Dispense:  30 tablet    Refill:  2  . hydrochlorothiazide (HYDRODIURIL) 25 MG tablet    Sig: Take 1 tablet (25 mg total) by mouth daily.    Dispense:  30 tablet    Refill:  2    Pt needs to establish with new provider call office for further refills

## 2016-12-17 NOTE — Patient Instructions (Signed)
Conjugated Estrogens vaginal cream What is this medicine? CONJUGATED ESTROGENS (CON ju gate ed ESS troe jenz) are a mixture of female hormones. This cream can help relieve symptoms associated with menopause.like vaginal dryness and irritation. This medicine may be used for other purposes; ask your health care provider or pharmacist if you have questions. COMMON BRAND NAME(S): Premarin What should I tell my health care provider before I take this medicine? They need to know if you have any of these conditions: -abnormal vaginal bleeding -blood vessel disease or blood clots -breast, cervical, endometrial, or uterine cancer -dementia -diabetes -gallbladder disease -heart disease or recent heart attack -high blood pressure -high cholesterol -high level of calcium in the blood -hysterectomy -kidney disease -liver disease -migraine headaches -protein C deficiency -protein S deficiency -stroke -systemic lupus erythematosus (SLE) -tobacco smoker -an unusual or allergic reaction to estrogens other medicines, foods, dyes, or preservatives -pregnant or trying to get pregnant -breast-feeding How should I use this medicine? This medicine is for use in the vagina only. Do not take by mouth. Follow the directions on the prescription label. Use at bedtime unless otherwise directed by your doctor or health care professional. Use the special applicator supplied with the cream. Wash hands before and after use. Fill the applicator with the cream and remove from the tube. Lie on your back, part and bend your knees. Insert the applicator into the vagina and push the plunger to expel the cream into the vagina. Wash the applicator with warm soapy water and rinse well. Use exactly as directed for the complete length of time prescribed. Do not stop using except on the advice of your doctor or health care professional. Talk to your pediatrician regarding the use of this medicine in children. Special care may be  needed. A patient package insert for the product will be given with each prescription and refill. Read this sheet carefully each time. The sheet may change frequently. Overdosage: If you think you have taken too much of this medicine contact a poison control center or emergency room at once. NOTE: This medicine is only for you. Do not share this medicine with others. What if I miss a dose? If you miss a dose, use it as soon as you can. If it is almost time for your next dose, use only that dose. Do not use double or extra doses. What may interact with this medicine? Do not take this medicine with any of the following medications: -aromatase inhibitors like aminoglutethimide, anastrozole, exemestane, letrozole, testolactone This medicine may also interact with the following medications: -barbiturates used for inducing sleep or treating seizures -carbamazepine -grapefruit juice -medicines for fungal infections like itraconazole and ketoconazole -raloxifene or tamoxifen -rifabutin -rifampin -rifapentine -ritonavir -some antibiotics used to treat infections -St. John's Wort -warfarin This list may not describe all possible interactions. Give your health care provider a list of all the medicines, herbs, non-prescription drugs, or dietary supplements you use. Also tell them if you smoke, drink alcohol, or use illegal drugs. Some items may interact with your medicine. What should I watch for while using this medicine? Visit your health care professional for regular checks on your progress. You will need a regular breast and pelvic exam. You should also discuss the need for regular mammograms with your health care professional, and follow his or her guidelines. This medicine can make your body retain fluid, making your fingers, hands, or ankles swell. Your blood pressure can go up. Contact your doctor or health care professional if you  feel you are retaining fluid. If you have any reason to think  you are pregnant; stop taking this medicine at once and contact your doctor or health care professional. Tobacco smoking increases the risk of getting a blood clot or having a stroke, especially if you are more than 57 years old. You are strongly advised not to smoke. If you wear contact lenses and notice visual changes, or if the lenses begin to feel uncomfortable, consult your eye care specialist. If you are going to have elective surgery, you may need to stop taking this medicine beforehand. Consult your health care professional for advice prior to scheduling the surgery. What side effects may I notice from receiving this medicine? Side effects that you should report to your doctor or health care professional as soon as possible: -allergic reactions like skin rash, itching or hives, swelling of the face, lips, or tongue -breast tissue changes or discharge -changes in vision -chest pain -confusion, trouble speaking or understanding -dark urine -general ill feeling or flu-like symptoms -light-colored stools -nausea, vomiting -pain, swelling, warmth in the leg -right upper belly pain -severe headaches -shortness of breath -sudden numbness or weakness of the face, arm or leg -trouble walking, dizziness, loss of balance or coordination -unusual vaginal bleeding -yellowing of the eyes or skin Side effects that usually do not require medical attention (report to your doctor or health care professional if they continue or are bothersome): -hair loss -increased hunger or thirst -increased urination -symptoms of vaginal infection like itching, irritation or unusual discharge -unusually weak or tired This list may not describe all possible side effects. Call your doctor for medical advice about side effects. You may report side effects to FDA at 1-800-FDA-1088. Where should I keep my medicine? Keep out of the reach of children. Store at room temperature between 15 and 30 degrees C (59 and 86  degrees F). Throw away any unused medicine after the expiration date. NOTE: This sheet is a summary. It may not cover all possible information. If you have questions about this medicine, talk to your doctor, pharmacist, or health care provider.  2018 Elsevier/Gold Standard (2010-07-22 09:20:36) Levothyroxine tablets What is this medicine? LEVOTHYROXINE (lee voe thye ROX een) is a thyroid hormone. This medicine can improve symptoms of thyroid deficiency such as slow speech, lack of energy, weight gain, hair loss, dry skin, and feeling cold. It also helps to treat goiter (an enlarged thyroid gland). It is also used to treat some kinds of thyroid cancer along with surgery and other medicines. This medicine may be used for other purposes; ask your health care provider or pharmacist if you have questions. COMMON BRAND NAME(S): Estre, Levo-T, Levothroid, Levoxyl, Synthroid, Thyro-Tabs, Unithroid What should I tell my health care provider before I take this medicine? They need to know if you have any of these conditions: -angina -blood clotting problems -diabetes -dieting or on a weight loss program -fertility problems -heart disease -high levels of thyroid hormone -pituitary gland problem -previous heart attack -an unusual or allergic reaction to levothyroxine, thyroid hormones, other medicines, foods, dyes, or preservatives -pregnant or trying to get pregnant -breast-feeding How should I use this medicine? Take this medicine by mouth with plenty of water. It is best to take on an empty stomach, at least 30 minutes before or 2 hours after food. Follow the directions on the prescription label. Take at the same time each day. Do not take your medicine more often than directed. Contact your pediatrician regarding the use of  this medicine in children. While this drug may be prescribed for children and infants as young as a few days of age for selected conditions, precautions do apply. For infants, you  may crush the tablet and place in a small amount of (5-10 ml or 1 to 2 teaspoonfuls) of water, breast milk, or non-soy based infant formula. Do not mix with soy-based infant formula. Give as directed. Overdosage: If you think you have taken too much of this medicine contact a poison control center or emergency room at once. NOTE: This medicine is only for you. Do not share this medicine with others. What if I miss a dose? If you miss a dose, take it as soon as you can. If it is almost time for your next dose, take only that dose. Do not take double or extra doses. What may interact with this medicine? -amiodarone -antacids -anti-thyroid medicines -calcium supplements -carbamazepine -cholestyramine -colestipol -digoxin -female hormones, including contraceptive or birth control pills -iron supplements -ketamine -liquid nutrition products like Ensure -medicines for colds and breathing difficulties -medicines for diabetes -medicines for mental depression -medicines or herbals used to decrease weight or appetite -phenobarbital or other barbiturate medications -phenytoin -prednisone or other corticosteroids -rifabutin -rifampin -soy isoflavones -sucralfate -theophylline -warfarin This list may not describe all possible interactions. Give your health care provider a list of all the medicines, herbs, non-prescription drugs, or dietary supplements you use. Also tell them if you smoke, drink alcohol, or use illegal drugs. Some items may interact with your medicine. What should I watch for while using this medicine? Be sure to take this medicine with plenty of fluids. Some tablets may cause choking, gagging, or difficulty swallowing from the tablet getting stuck in your throat. Most of these problems disappear if the medicine is taken with the right amount of water or other fluids. Do not switch brands of this medicine unless your health care professional agrees with the change. Ask questions  if you are uncertain. You will need regular exams and occasional blood tests to check the response to treatment. If you are receiving this medicine for an underactive thyroid, it may be several weeks before you notice an improvement. Check with your doctor or health care professional if your symptoms do not improve. It may be necessary for you to take this medicine for the rest of your life. Do not stop using this medicine unless your doctor or health care professional advises you to. This medicine can affect blood sugar levels. If you have diabetes, check your blood sugar as directed. You may lose some of your hair when you first start treatment. With time, this usually corrects itself. If you are going to have surgery, tell your doctor or health care professional that you are taking this medicine. What side effects may I notice from receiving this medicine? Side effects that you should report to your doctor or health care professional as soon as possible: -allergic reactions like skin rash, itching or hives, swelling of the face, lips, or tongue -chest pain -excessive sweating or intolerance to heat -fast or irregular heartbeat -nervousness -skin rash or hives -swelling of ankles, feet, or legs -tremors Side effects that usually do not require medical attention (report to your doctor or health care professional if they continue or are bothersome): -changes in appetite -changes in menstrual periods -diarrhea -hair loss -headache -trouble sleeping -weight loss This list may not describe all possible side effects. Call your doctor for medical advice about side effects. You may report side  effects to FDA at 1-800-FDA-1088. Where should I keep my medicine? Keep out of the reach of children. Store at room temperature between 15 and 30 degrees C (59 and 86 degrees F). Protect from light and moisture. Keep container tightly closed. Throw away any unused medicine after the expiration date. NOTE:  This sheet is a summary. It may not cover all possible information. If you have questions about this medicine, talk to your doctor, pharmacist, or health care provider.  2018 Elsevier/Gold Standard (2008-07-26 14:28:07) Aliskiren; Hydrochlorothiazide, HCTZ Tablet What is this medicine? ALISKIREN; HYDROCHLOROTHIAZIDE (a lis KYE ren; hye droe klor oh THYE a zide) is a combination of a renin inhibitor and a diuretic. It is used to treat high blood pressure. This medicine may be used for other purposes; ask your health care provider or pharmacist if you have questions. COMMON BRAND NAME(S): Tekturna HCT What should I tell my health care provider before I take this medicine? They need to know if you have any of these conditions: -dehydration -diabetes -gout -kidney disease or kidney stones -liver disease -pancreatitis -small amount of urine or difficulty passing urine -an unusual or allergic reaction to aliskiren, hydrochlorothiazide, HCTZ, other medicines, foods, dyes, or preservatives -pregnant or trying to get pregnant -breast-feeding How should I use this medicine? Take this medicine by mouth with a glass of water. Follow the directions on your prescription label. You can take this medicine with or without food. However, you should always take it the same way each time. Take your medicine at regular intervals. Do not take it more often than directed. Do not stop taking except on your doctor's advice. Talk to your pediatrician regarding the use of this medicine in children. Special care may be needed. Overdosage: If you think you have taken too much of this medicine contact a poison control center or emergency room at once. NOTE: This medicine is only for you. Do not share this medicine with others. What if I miss a dose? If you miss a dose, take it as soon as you can. If it is almost time for your next dose, take only that dose. Do not take double or extra doses. What may interact with this  medicine? -alcohol -atorvastatin -barbiturates -cholestyramine -colestipol -digoxin -dofetilide -furosemide -irbesartan -lithium -medicines for blood pressure -medicines for diabetes -medicines for fungal infections like ketoconazole -medicines that relax muscles for surgery -narcotic medicines for pain -NSAIDs, medicines for pain and inflammation, like ibuprofen or naproxen -potassium supplements -steroid medicines like prednisone or cortisone This list may not describe all possible interactions. Give your health care provider a list of all the medicines, herbs, non-prescription drugs, or dietary supplements you use. Also tell them if you smoke, drink alcohol, or use illegal drugs. Some items may interact with your medicine. What should I watch for while using this medicine? Visit your doctor for regular check ups. Check your blood pressure as directed. Ask your doctor what your blood pressure should be and when you should contact him or her. This medicine may affect your blood sugar level. If you have diabetes, check with your doctor or health care professional before changing the dose of your diabetic medicine. Do not take this medicine if you have diabetes and are taking a medicine called an angiotensin-receptor-blocker (ARB) or angiotensin-converting-enzyme-inhibitor (ACE inhibitor). Talk to your doctor or health care professional for more information. Women should inform their doctor if they wish to become pregnant or think they might be pregnant. There is a potential for serious side  effects to an unborn child. Talk to your health care professional or pharmacist for more information. You may need to be on a special diet while taking this medicine. Ask your doctor. Check with your doctor or health care professional if you get an attack of severe diarrhea, nausea and vomiting, or if you sweat a lot. The loss of too much body fluid can make it dangerous for you to take this  medicine. You may get drowsy or dizzy. Do not drive, use machinery, or do anything that needs mental alertness until you know how this medicine affects you. Do not stand or sit up quickly, especially if you are an older patient. This reduces the risk of dizzy or fainting spells. Alcohol may interfere with the effect of this medicine. Avoid alcoholic drinks. This medicine can make you more sensitive to the sun. Keep out of the sun. If you cannot avoid being in the sun, wear protective clothing and use sunscreen. Do not use sun lamps or tanning beds/booths. What side effects may I notice from receiving this medicine? Side effects that you should report to your doctor or health care professional as soon as possible: -allergic reactions like skin rash or hives, swelling of the hands, feet, face, lips, throat, or tongue -breathing problems -changes in vision -eye pain -fast, irregular heartbeat -feeling faint or lightheaded, falls -fever or sore throat -gout pain -low blood pressure -muscle pain or cramps -pain, tingling, numbness in the hands or feet -pain or difficulty passing urine -redness, blistering, peeling or loosening of the skin, including inside the mouth -seizures -unusually weak or tired Side effects that usually do not require medical attention (report to your doctor or health care professional if they continue or are bothersome): -change in sex drive or performance -cough -diarrhea -dizziness -dry mouth -flu-like symptoms -headache -stomach upset This list may not describe all possible side effects. Call your doctor for medical advice about side effects. You may report side effects to FDA at 1-800-FDA-1088. Where should I keep my medicine? Keep out of the reach of children. Store at room temperature between 15 and 30 degrees C (59 and 86 degrees F). Protect from moisture. Throw away any unused medicine after the expiration date. NOTE: This sheet is a summary. It may not  cover all possible information. If you have questions about this medicine, talk to your doctor, pharmacist, or health care provider.  2018 Elsevier/Gold Standard (2010-08-21 14:56:24) Health Maintenance, Female Adopting a healthy lifestyle and getting preventive care can go a long way to promote health and wellness. Talk with your health care provider about what schedule of regular examinations is right for you. This is a good chance for you to check in with your provider about disease prevention and staying healthy. In between checkups, there are plenty of things you can do on your own. Experts have done a lot of research about which lifestyle changes and preventive measures are most likely to keep you healthy. Ask your health care provider for more information. Weight and diet Eat a healthy diet  Be sure to include plenty of vegetables, fruits, low-fat dairy products, and lean protein.  Do not eat a lot of foods high in solid fats, added sugars, or salt.  Get regular exercise. This is one of the most important things you can do for your health. ? Most adults should exercise for at least 150 minutes each week. The exercise should increase your heart rate and make you sweat (moderate-intensity exercise). ? Most adults  should also do strengthening exercises at least twice a week. This is in addition to the moderate-intensity exercise.  Maintain a healthy weight  Body mass index (BMI) is a measurement that can be used to identify possible weight problems. It estimates body fat based on height and weight. Your health care provider can help determine your BMI and help you achieve or maintain a healthy weight.  For females 58 years of age and older: ? A BMI below 18.5 is considered underweight. ? A BMI of 18.5 to 24.9 is normal. ? A BMI of 25 to 29.9 is considered overweight. ? A BMI of 30 and above is considered obese.  Watch levels of cholesterol and blood lipids  You should start having  your blood tested for lipids and cholesterol at 57 years of age, then have this test every 5 years.  You may need to have your cholesterol levels checked more often if: ? Your lipid or cholesterol levels are high. ? You are older than 57 years of age. ? You are at high risk for heart disease.  Cancer screening Lung Cancer  Lung cancer screening is recommended for adults 73-59 years old who are at high risk for lung cancer because of a history of smoking.  A yearly low-dose CT scan of the lungs is recommended for people who: ? Currently smoke. ? Have quit within the past 15 years. ? Have at least a 30-pack-year history of smoking. A pack year is smoking an average of one pack of cigarettes a day for 1 year.  Yearly screening should continue until it has been 15 years since you quit.  Yearly screening should stop if you develop a health problem that would prevent you from having lung cancer treatment.  Breast Cancer  Practice breast self-awareness. This means understanding how your breasts normally appear and feel.  It also means doing regular breast self-exams. Let your health care provider know about any changes, no matter how small.  If you are in your 20s or 30s, you should have a clinical breast exam (CBE) by a health care provider every 1-3 years as part of a regular health exam.  If you are 34 or older, have a CBE every year. Also consider having a breast X-ray (mammogram) every year.  If you have a family history of breast cancer, talk to your health care provider about genetic screening.  If you are at high risk for breast cancer, talk to your health care provider about having an MRI and a mammogram every year.  Breast cancer gene (BRCA) assessment is recommended for women who have family members with BRCA-related cancers. BRCA-related cancers include: ? Breast. ? Ovarian. ? Tubal. ? Peritoneal cancers.  Results of the assessment will determine the need for genetic  counseling and BRCA1 and BRCA2 testing.  Cervical Cancer Your health care provider may recommend that you be screened regularly for cancer of the pelvic organs (ovaries, uterus, and vagina). This screening involves a pelvic examination, including checking for microscopic changes to the surface of your cervix (Pap test). You may be encouraged to have this screening done every 3 years, beginning at age 20.  For women ages 62-65, health care providers may recommend pelvic exams and Pap testing every 3 years, or they may recommend the Pap and pelvic exam, combined with testing for human papilloma virus (HPV), every 5 years. Some types of HPV increase your risk of cervical cancer. Testing for HPV may also be done on women of any  age with unclear Pap test results.  Other health care providers may not recommend any screening for nonpregnant women who are considered low risk for pelvic cancer and who do not have symptoms. Ask your health care provider if a screening pelvic exam is right for you.  If you have had past treatment for cervical cancer or a condition that could lead to cancer, you need Pap tests and screening for cancer for at least 20 years after your treatment. If Pap tests have been discontinued, your risk factors (such as having a new sexual partner) need to be reassessed to determine if screening should resume. Some women have medical problems that increase the chance of getting cervical cancer. In these cases, your health care provider may recommend more frequent screening and Pap tests.  Colorectal Cancer  This type of cancer can be detected and often prevented.  Routine colorectal cancer screening usually begins at 57 years of age and continues through 57 years of age.  Your health care provider may recommend screening at an earlier age if you have risk factors for colon cancer.  Your health care provider may also recommend using home test kits to check for hidden blood in the  stool.  A small camera at the end of a tube can be used to examine your colon directly (sigmoidoscopy or colonoscopy). This is done to check for the earliest forms of colorectal cancer.  Routine screening usually begins at age 69.  Direct examination of the colon should be repeated every 5-10 years through 57 years of age. However, you may need to be screened more often if early forms of precancerous polyps or small growths are found.  Skin Cancer  Check your skin from head to toe regularly.  Tell your health care provider about any new moles or changes in moles, especially if there is a change in a mole's shape or color.  Also tell your health care provider if you have a mole that is larger than the size of a pencil eraser.  Always use sunscreen. Apply sunscreen liberally and repeatedly throughout the day.  Protect yourself by wearing long sleeves, pants, a wide-brimmed hat, and sunglasses whenever you are outside.  Heart disease, diabetes, and high blood pressure  High blood pressure causes heart disease and increases the risk of stroke. High blood pressure is more likely to develop in: ? People who have blood pressure in the high end of the normal range (130-139/85-89 mm Hg). ? People who are overweight or obese. ? People who are African American.  If you are 46-70 years of age, have your blood pressure checked every 3-5 years. If you are 50 years of age or older, have your blood pressure checked every year. You should have your blood pressure measured twice-once when you are at a hospital or clinic, and once when you are not at a hospital or clinic. Record the average of the two measurements. To check your blood pressure when you are not at a hospital or clinic, you can use: ? An automated blood pressure machine at a pharmacy. ? A home blood pressure monitor.  If you are between 58 years and 35 years old, ask your health care provider if you should take aspirin to prevent  strokes.  Have regular diabetes screenings. This involves taking a blood sample to check your fasting blood sugar level. ? If you are at a normal weight and have a low risk for diabetes, have this test once every three years after  57 years of age. ? If you are overweight and have a high risk for diabetes, consider being tested at a younger age or more often. Preventing infection Hepatitis B  If you have a higher risk for hepatitis B, you should be screened for this virus. You are considered at high risk for hepatitis B if: ? You were born in a country where hepatitis B is common. Ask your health care provider which countries are considered high risk. ? Your parents were born in a high-risk country, and you have not been immunized against hepatitis B (hepatitis B vaccine). ? You have HIV or AIDS. ? You use needles to inject street drugs. ? You live with someone who has hepatitis B. ? You have had sex with someone who has hepatitis B. ? You get hemodialysis treatment. ? You take certain medicines for conditions, including cancer, organ transplantation, and autoimmune conditions.  Hepatitis C  Blood testing is recommended for: ? Everyone born from [birth year] through 1966. ? Anyone with known risk factors for hepatitis C.  Sexually transmitted infections (STIs)  You should be screened for sexually transmitted infections (STIs) including gonorrhea and chlamydia if: ? You are sexually active and are younger than 57 years of age. ? You are older than 57 years of age and your health care provider tells you that you are at risk for this type of infection. ? Your sexual activity has changed since you were last screened and you are at an increased risk for chlamydia or gonorrhea. Ask your health care provider if you are at risk.  If you do not have HIV, but are at risk, it may be recommended that you take a prescription medicine daily to prevent HIV infection. This is called pre-exposure prophylaxis  (PrEP). You are considered at risk if: ? You are sexually active and do not regularly use condoms or know the HIV status of your partner(s). ? You take drugs by injection. ? You are sexually active with a partner who has HIV.  Talk with your health care provider about whether you are at high risk of being infected with HIV. If you choose to begin PrEP, you should first be tested for HIV. You should then be tested every 3 months for as long as you are taking PrEP. Pregnancy  If you are premenopausal and you may become pregnant, ask your health care provider about preconception counseling.  If you may become pregnant, take 400 to 800 micrograms (mcg) of folic acid every day.  If you want to prevent pregnancy, talk to your health care provider about birth control (contraception). Osteoporosis and menopause  Osteoporosis is a disease in which the bones lose minerals and strength with aging. This can result in serious bone fractures. Your risk for osteoporosis can be identified using a bone density scan.  If you are 60 years of age or older, or if you are at risk for osteoporosis and fractures, ask your health care provider if you should be screened.  Ask your health care provider whether you should take a calcium or vitamin D supplement to lower your risk for osteoporosis.  Menopause may have certain physical symptoms and risks.  Hormone replacement therapy may reduce some of these symptoms and risks. Talk to your health care provider about whether hormone replacement therapy is right for you. Follow these instructions at home:  Schedule regular health, dental, and eye exams.  Stay current with your immunizations.  Do not use any tobacco products including cigarettes, chewing  tobacco, or electronic cigarettes.  If you are pregnant, do not drink alcohol.  If you are breastfeeding, limit how much and how often you drink alcohol.  Limit alcohol intake to no more than 1 drink per day for  nonpregnant women. One drink equals 12 ounces of beer, 5 ounces of wine, or 1 ounces of hard liquor.  Do not use street drugs.  Do not share needles.  Ask your health care provider for help if you need support or information about quitting drugs.  Tell your health care provider if you often feel depressed.  Tell your health care provider if you have ever been abused or do not feel safe at home. This information is not intended to replace advice given to you by your health care provider. Make sure you discuss any questions you have with your health care provider. Document Released: 11/02/2010 Document Revised: 09/25/2015 Document Reviewed: 01/21/2015 Elsevier Interactive Patient Education  2018 [employer]. How to Take Your Blood Pressure You can take your blood pressure at home with a machine. You may need to check your blood pressure at home:  To check if you have high blood pressure (hypertension).  To check your blood pressure over time.  To make sure your blood pressure medicine is working.  Supplies needed: You will need a blood pressure machine, or monitor. You can buy one at a drugstore or online. When choosing one:  Choose one with an arm cuff.  Choose one that wraps around your upper arm. Only one finger should fit between your arm and the cuff.  Do not choose one that measures your blood pressure from your wrist or finger.  Your doctor can suggest a monitor. How to prepare Avoid these things for 30 minutes before checking your blood pressure:  Drinking caffeine.  Drinking alcohol.  Eating.  Smoking.  Exercising.  Five minutes before checking your blood pressure:  Pee.  Sit in a dining chair. Avoid sitting in a soft couch or armchair.  Be quiet. Do not talk.  How to take your blood pressure Follow the instructions that came with your machine. If you have a digital blood pressure monitor, these may be the instructions: 1. Sit up straight. 2. Place  your feet on the floor. Do not cross your ankles or legs. 3. Rest your left arm at the level of your heart. You may rest it on a table, desk, or chair. 4. Pull up your shirt sleeve. 5. Wrap the blood pressure cuff around the upper part of your left arm. The cuff should be 1 inch (2.5 cm) above your elbow. It is best to wrap the cuff around bare skin. 6. Fit the cuff snugly around your arm. You should be able to place only one finger between the cuff and your arm. 7. Put the cord inside the groove of your elbow. 8. Press the power button. 9. Sit quietly while the cuff fills with air and loses air. 10. Write down the numbers on the screen. 11. Wait 2-3 minutes and then repeat steps 1-10.  What do the numbers mean? Two numbers make up your blood pressure. The first number is called systolic pressure. The second is called diastolic pressure. An example of a blood pressure reading is "120 over 80" (or 120/80). If you are an adult and do not have a medical condition, use this guide to find out if your blood pressure is normal: Normal  First number: below 120.  Second number: below 80. Elevated  First number: [id number].  Second number: below 80. Hypertension stage 1  First number: 130-139.  Second number: 80-89. Hypertension stage 2  First number: 140 or above.  Second number: 12 or above. Your blood pressure is above normal even if only the top or bottom number is above normal. Follow these instructions at home:  Check your blood pressure as often as your doctor tells you to.  Take your monitor to your next doctor's appointment. Your doctor will: ? Make sure you are using it correctly. ? Make sure it is working right.  Make sure you understand what your blood pressure numbers should be.  Tell your doctor if your medicines are causing side effects. Contact a doctor if:  Your blood pressure keeps being high. Get help right away if:  Your first blood pressure number is higher  than 180.  Your second blood pressure number is higher than 120. This information is not intended to replace advice given to you by your health care provider. Make sure you discuss any questions you have with your health care provider. Document Released: 04/01/2008 Document Revised: 03/17/2016 Document Reviewed: 09/26/2015 Elsevier Interactive Patient Education  2018 [employer]. Hypertension Hypertension, commonly called high blood pressure, is when the force of blood pumping through the arteries is too strong. The arteries are the blood vessels that carry blood from the heart throughout the body. Hypertension forces the heart to work harder to pump blood and may cause arteries to become narrow or stiff. Having untreated or uncontrolled hypertension can cause heart attacks, strokes, kidney disease, and other problems. A blood pressure reading consists of a higher number over a lower number. Ideally, your blood pressure should be below 120/80. The first ("top") number is called the systolic pressure. It is a measure of the pressure in your arteries as your heart beats. The second ("bottom") number is called the diastolic pressure. It is a measure of the pressure in your arteries as the heart relaxes. What are the causes? The cause of this condition is not known. What increases the risk? Some risk factors for high blood pressure are under your control. Others are not. Factors you can change  Smoking.  Having type 2 diabetes mellitus, high cholesterol, or both.  Not getting enough exercise or physical activity.  Being overweight.  Having too much fat, sugar, calories, or salt (sodium) in your diet.  Drinking too much alcohol. Factors that are difficult or impossible to change  Having chronic kidney disease.  Having a family history of high blood pressure.  Age. Risk increases with age.  Race. You may be at higher risk if you are African-American.  Gender. Men are at higher risk  than women before age 11. After age 27, women are at higher risk than men.  Having obstructive sleep apnea.  Stress. What are the signs or symptoms? Extremely high blood pressure (hypertensive crisis) may cause:  Headache.  Anxiety.  Shortness of breath.  Nosebleed.  Nausea and vomiting.  Severe chest pain.  Jerky movements you cannot control (seizures).  How is this diagnosed? This condition is diagnosed by measuring your blood pressure while you are seated, with your arm resting on a surface. The cuff of the blood pressure monitor will be placed directly against the skin of your upper arm at the level of your heart. It should be measured at least twice using the same arm. Certain conditions can cause a difference in blood pressure between your right and left arms. Certain factors can  cause blood pressure readings to be lower or higher than normal (elevated) for a short period of time:  When your blood pressure is higher when you are in a health care provider's office than when you are at home, this is called white coat hypertension. Most people with this condition do not need medicines.  When your blood pressure is higher at home than when you are in a health care provider's office, this is called masked hypertension. Most people with this condition may need medicines to control blood pressure.  If you have a high blood pressure reading during one visit or you have normal blood pressure with other risk factors:  You may be asked to return on a different day to have your blood pressure checked again.  You may be asked to monitor your blood pressure at home for 1 week or longer.  If you are diagnosed with hypertension, you may have other blood or imaging tests to help your health care provider understand your overall risk for other conditions. How is this treated? This condition is treated by making healthy lifestyle changes, such as eating healthy foods, exercising more, and  reducing your alcohol intake. Your health care provider may prescribe medicine if lifestyle changes are not enough to get your blood pressure under control, and if:  Your systolic blood pressure is above 130.  Your diastolic blood pressure is above 80.  Your personal target blood pressure may vary depending on your medical conditions, your age, and other factors. Follow these instructions at home: Eating and drinking  Eat a diet that is high in fiber and potassium, and low in sodium, added sugar, and fat. An example eating plan is called the DASH (Dietary Approaches to Stop Hypertension) diet. To eat this way: ? Eat plenty of fresh fruits and vegetables. Try to fill half of your plate at each meal with fruits and vegetables. ? Eat whole grains, such as whole wheat pasta, brown rice, or whole grain bread. Fill about one quarter of your plate with whole grains. ? Eat or drink low-fat dairy products, such as skim milk or low-fat yogurt. ? Avoid fatty cuts of meat, processed or cured meats, and poultry with skin. Fill about one quarter of your plate with lean proteins, such as fish, chicken without skin, beans, eggs, and tofu. ? Avoid premade and processed foods. These tend to be higher in sodium, added sugar, and fat.  Reduce your daily sodium intake. Most people with hypertension should eat less than 1,500 mg of sodium a day.  Limit alcohol intake to no more than 1 drink a day for nonpregnant women and 2 drinks a day for men. One drink equals 12 oz of beer, 5 oz of wine, or 1 oz of hard liquor. Lifestyle  Work with your health care provider to maintain a healthy body weight or to lose weight. Ask what an ideal weight is for you.  Get at least 30 minutes of exercise that causes your heart to beat faster (aerobic exercise) most days of the week. Activities may include walking, swimming, or biking.  Include exercise to strengthen your muscles (resistance exercise), such as pilates or lifting  weights, as part of your weekly exercise routine. Try to do these types of exercises for 30 minutes at least 3 days a week.  Do not use any products that contain nicotine or tobacco, such as cigarettes and e-cigarettes. If you need help quitting, ask your health care provider.  Monitor your blood pressure at  home as told by your health care provider.  Keep all follow-up visits as told by your health care provider. This is important. Medicines  Take over-the-counter and prescription medicines only as told by your health care provider. Follow directions carefully. Blood pressure medicines must be taken as prescribed.  Do not skip doses of blood pressure medicine. Doing this puts you at risk for problems and can make the medicine less effective.  Ask your health care provider about side effects or reactions to medicines that you should watch for. Contact a health care provider if:  You think you are having a reaction to a medicine you are taking.  You have headaches that keep coming back (recurring).  You feel dizzy.  You have swelling in your ankles.  You have trouble with your vision. Get help right away if:  You develop a severe headache or confusion.  You have unusual weakness or numbness.  You feel faint.  You have severe pain in your chest or abdomen.  You vomit repeatedly.  You have trouble breathing. Summary  Hypertension is when the force of blood pumping through your arteries is too strong. If this condition is not controlled, it may put you at risk for serious complications.  Your personal target blood pressure may vary depending on your medical conditions, your age, and other factors. For most people, a normal blood pressure is less than 120/80.  Hypertension is treated with lifestyle changes, medicines, or a combination of both. Lifestyle changes include weight loss, eating a healthy, low-sodium diet, exercising more, and limiting alcohol. This information is  not intended to replace advice given to you by your health care provider. Make sure you discuss any questions you have with your health care provider. Document Released: 04/19/2005 Document Revised: 03/17/2016 Document Reviewed: 03/17/2016 Elsevier Interactive Patient Education  2018 [place]. Blood Glucose Monitoring, Adult Monitoring your blood sugar (glucose) helps you manage your diabetes. It also helps you and your health care provider determine how well your diabetes management plan is working. Blood glucose monitoring involves checking your blood glucose as often as directed, and keeping a record (log) of your results over time. Why should I monitor my blood glucose? Checking your blood glucose regularly can:  Help you understand how food, exercise, illnesses, and medicines affect your blood glucose.  Let you know what your blood glucose is at any time. You can quickly tell if you are having low blood glucose (hypoglycemia) or high blood glucose (hyperglycemia).  Help you and your health care provider adjust your medicines as needed.  When should I check my blood glucose? Follow instructions from your health care provider about how often to check your blood glucose. This may depend on:  The type of diabetes you have.  How well-controlled your diabetes is.  Medicines you are taking.  If you have type 1 diabetes:  Check your blood glucose at least 2 times a day.  Also check your blood glucose: ? Before every insulin injection. ? Before and after exercise. ? Between meals. ? 2 hours after a meal. ? Occasionally between 2:00 a.m. and 3:00 a.m., as directed. ? Before potentially dangerous tasks, like driving or using heavy machinery. ? At bedtime.  You may need to check your blood glucose more often, up to 6-10 times a day: ? If you use an insulin pump. ? If you need multiple daily injections (MDI). ? If your diabetes is not well-controlled. ? If you are ill. ? If you  have  a history of severe hypoglycemia. ? If you have a history of not knowing when your blood glucose is getting low (hypoglycemia unawareness). If you have type 2 diabetes:  If you take insulin or other diabetes medicines, check your blood glucose at least 2 times a day.  If you are on intensive insulin therapy, check your blood glucose at least 4 times a day. Occasionally, you may also need to check between 2:00 a.m. and 3:00 a.m., as directed.  Also check your blood glucose: ? Before and after exercise. ? Before potentially dangerous tasks, like driving or using heavy machinery.  You may need to check your blood glucose more often if: ? Your medicine is being adjusted. ? Your diabetes is not well-controlled. ? You are ill. What is a blood glucose log?  A blood glucose log is a record of your blood glucose readings. It helps you and your health care provider: ? Look for patterns in your blood glucose over time. ? Adjust your diabetes management plan as needed.  Every time you check your blood glucose, write down your result and notes about things that may be affecting your blood glucose, such as your diet and exercise for the day.  Most glucose meters store a record of glucose readings in the meter. Some meters allow you to download your records to a computer. How do I check my blood glucose? Follow these steps to get accurate readings of your blood glucose: Supplies needed   Blood glucose meter.  Test strips for your meter. Each meter has its own strips. You must use the strips that come with your meter.  A needle to prick your finger (lancet). Do not use lancets more than once.  A device that holds the lancet (lancing device).  A journal or log book to write down your results. Procedure  Wash your hands with soap and water.  Prick the side of your finger (not the tip) with the lancet. Use a different finger each time.  Gently rub the finger until a small drop of blood  appears.  Follow instructions that come with your meter for inserting the test strip, applying blood to the strip, and using your blood glucose meter.  Write down your result and any notes. Alternative testing sites  Some meters allow you to use areas of your body other than your finger (alternative sites) to test your blood.  If you think you may have hypoglycemia, or if you have hypoglycemia unawareness, do not use alternative sites. Use your finger instead.  Alternative sites may not be as accurate as the fingers, because blood flow is slower in these areas. This means that the result you get may be delayed, and it may be different from the result that you would get from your finger.  The most common alternative sites are: ? Forearm. ? Thigh. ? Palm of the hand. Additional tips  Always keep your supplies with you.  If you have questions or need help, all blood glucose meters have a 24-hour "hotline" number that you can call. You may also contact your health care provider.  After you use a few boxes of test strips, adjust (calibrate) your blood glucose meter by following instructions that came with your meter. This information is not intended to replace advice given to you by your health care provider. Make sure you discuss any questions you have with your health care provider. Document Released: 04/22/2003 Document Revised: 11/07/2015 Document Reviewed: 09/29/2015 Elsevier Interactive Patient Education  2017  [place]. Colonoscopy, Adult A colonoscopy is an exam to look at the entire large intestine. During the exam, a lubricated, bendable tube is inserted into the anus and then passed into the rectum, colon, and other parts of the large intestine. A colonoscopy is often done as a part of normal colorectal screening or in response to certain symptoms, such as anemia, persistent diarrhea, abdominal pain, and blood in the stool. The exam can help screen for and diagnose medical  problems, including:  Tumors.  Polyps.  Inflammation.  Areas of bleeding.  Tell a health care provider about:  Any allergies you have.  All medicines you are taking, including vitamins, herbs, eye drops, creams, and over-the-counter medicines.  Any problems you or family members have had with anesthetic medicines.  Any blood disorders you have.  Any surgeries you have had.  Any medical conditions you have.  Any problems you have had passing stool. What are the risks? Generally, this is a safe procedure. However, problems may occur, including:  Bleeding.  A tear in the intestine.  A reaction to medicines given during the exam.  Infection (rare).  What happens before the procedure? Eating and drinking restrictions Follow instructions from your health care provider about eating and drinking, which may include:  A few days before the procedure - follow a low-fiber diet. Avoid nuts, seeds, dried fruit, raw fruits, and vegetables.  1-3 days before the procedure - follow a clear liquid diet. Drink only clear liquids, such as clear broth or bouillon, black coffee or tea, clear juice, clear soft drinks or sports drinks, gelatin dessert, and popsicles. Avoid any liquids that contain red or purple dye.  On the day of the procedure - do not eat or drink anything during the 2 hours before the procedure, or within the time period that your health care provider recommends.  Bowel prep If you were prescribed an oral bowel prep to clean out your colon:  Take it as told by your health care provider. Starting the day before your procedure, you will need to drink a large amount of medicated liquid. The liquid will cause you to have multiple loose stools until your stool is almost clear or light green.  If your skin or anus gets irritated from diarrhea, you may use these to relieve the irritation: ? Medicated wipes, such as adult wet wipes with aloe and vitamin E. ? A skin  soothing-product like petroleum jelly.  If you vomit while drinking the bowel prep, take a break for up to 60 minutes and then begin the bowel prep again. If vomiting continues and you cannot take the bowel prep without vomiting, call your health care provider.  General instructions  Ask your health care provider about changing or stopping your regular medicines. This is especially important if you are taking diabetes medicines or blood thinners.  Plan to have someone take you home from the hospital or clinic. What happens during the procedure?  An IV tube may be inserted into one of your veins.  You will be given medicine to help you relax (sedative).  To reduce your risk of infection: ? Your health care team will wash or sanitize their hands. ? Your anal area will be washed with soap.  You will be asked to lie on your side with your knees bent.  Your health care provider will lubricate a long, thin, flexible tube. The tube will have a camera and a light on the end.  The tube will be  inserted into your anus.  The tube will be gently eased through your rectum and colon.  Air will be delivered into your colon to keep it open. You may feel some pressure or cramping.  The camera will be used to take images during the procedure.  A small tissue sample may be removed from your body to be examined under a microscope (biopsy). If any potential problems are found, the tissue will be sent to a lab for testing.  If small polyps are found, your health care provider may remove them and have them checked for cancer cells.  The tube that was inserted into your anus will be slowly removed. The procedure may vary among health care providers and hospitals. What happens after the procedure?  Your blood pressure, heart rate, breathing rate, and blood oxygen level will be monitored until the medicines you were given have worn off.  Do not drive for 24 hours after the exam.  You may have a  small amount of blood in your stool.  You may pass gas and have mild abdominal cramping or bloating due to the air that was used to inflate your colon during the exam.  It is up to you to get the results of your procedure. Ask your health care provider, or the department performing the procedure, when your results will be ready. This information is not intended to replace advice given to you by your health care provider. Make sure you discuss any questions you have with your health care provider. Document Released: 04/16/2000 Document Revised: 02/18/2016 Document Reviewed: 07/01/2015 Elsevier Interactive Patient Education  2018 [place]. Pelvic Exam A pelvic exam is an exam of a woman's outer and inner genitals and reproductive organs. Pelvic exams are done to screen for health problems and to help prevent health problems from developing. You should start having pelvic exams when you turn 57 years old, unless your health care provider recommends having a pelvic exam earlier. Talk with your health care provider about how often you should have a pelvic exam. During your pelvic exam, your health care provider may ask you questions about your health, your family's health, your menstrual periods, immunizations, and your sexual activity. The information shared between you and your health care provider will not be shared with anyone else. What are some reasons to have a pelvic exam? There are many possible reasons for having a pelvic exam. A pelvic exam may be recommended to check for:  Normal development and function of the reproductive organs.  Cancer of the ovaries, uterus, or vagina.  Signs of sexually transmitted infections (STIs) or other types of infections.  Pregnancy. If you are pregnant, a pelvic exam can also help determine how far along you are in your pregnancy.  Widening (dilation) of the cervix during labor.  Injury (trauma) to the reproductive organs.  A pelvic exam may be  recommended to help explain or diagnose:  Changes in your body that may be signs of cancer in the reproductive system.  Inability to get pregnant (infertility).  Vaginal itching or burning.  Abnormal vaginal discharge or bleeding.  Problems with sexual function.  Problems with urination, such as: ? Painful urination. ? Frequent urinary tract infections. ? Inability to control when you urinate (urinary incontinence).  Problems with menstrual periods, such as: ? Severe cramping. ? Absence of any menstrual flow in a female by the age of 41 years (primary amenorrhea). ? Stopping of menstrual flow for 3-6 months at a time (secondary amenorrhea).  Depending on the purpose of your pelvic exam, your health care provider may perform:  A Pap test. This is sometimes called a Pap smear. It is a screening test that is used to check for signs of cancer of the vagina, cervix, and uterus. The test can also identify the presence of infection or precancerous changes.  A cervical biopsy. This is the removal of a small sample of tissue from the cervix. The cervix is the lowest part of the womb (uterus), which opens into the vagina (birth canal). The tissue will be checked under a microscope.  Other diagnostic tests that involve taking samples of tissue or fluid (cultures).  If you have tests done, it is your responsibility to get your test results. Ask your health care provider or the department performing the test when your results will be ready. How is a pelvic exam performed? Usually, a physical exam is done first. This may include:  An exam of your breasts. Your health care provider may feel your breasts to check for abnormalities.  An exam of your abdomen. Your health care provider may press on your abdomen to check for abnormalities.  Pelvic exams may vary among health care providers and hospitals. The following things are usually done during a pelvic exam:  You will remove your clothes  from the waist down. You will put on a gown or a wrap to cover yourself while you get ready for the exam.  You will lie on your back on a special table. Your feet will be placed into foot rests (stirrups) so that your legs are wide apart and your knees are bent. A drape will be placed over your abdomen and your legs.  Your health care provider will examine your outer genitals to check for anything unusual. This includes your clitoris, urethra, vaginal opening, labia, and the skin between your vagina and your anus (perineum).  Your health care provider will examine your inner genitals. To do this, a lubricated instrument (speculum) will be inserted into your vagina. The speculum will be widened to open the walls of your vagina. ? Your health care provider will examine your vagina and cervix. ? A Pap test, cervical biopsy, or cultures may be done as needed. ? After the internal exam is done, the speculum will be removed.  Your health care provider will put on germ-free (sterile) latex gloves and insert two fingers into your vagina to gently press against various organs. ? Your health care provider may use his or her other hand to gently press on your lower abdomen while doing this.  A pelvic exam is usually painless, although it can cause mild discomfort. If you experience pain at any time during your pelvic exam, tell your health care provider right away. When should I seek medical care? Seek medical care after your pelvic exam if:  You develop new symptoms.  You experience pain or discomfort from anything that was done during your pelvic exam.  This information is not intended to replace advice given to you by your health care provider. Make sure you discuss any questions you have with your health care provider. Document Released: 07/10/2002 Document Revised: 09/03/2015 Document Reviewed: 01/21/2015 Elsevier Interactive Patient Education  2018 [employer]. Cholesterol Cholesterol is a  fat. Your body needs a small amount of cholesterol. Cholesterol (plaque) may build up in your blood vessels (arteries). That makes you more likely to have a heart attack or stroke. You cannot feel your cholesterol level. Having a blood test  is the only way to find out if your level is high. Keep your test results. Work with your doctor to keep your cholesterol at a good level. What do the results mean?  Total cholesterol is how much cholesterol is in your blood.  LDL is bad cholesterol. This is the type that can build up. Try to have low LDL.  HDL is good cholesterol. It cleans your blood vessels and carries LDL away. Try to have high HDL.  Triglycerides are fat that the body can store or burn for energy. What are good levels of cholesterol?  Total cholesterol below 200.  LDL below 100 is good for people who have health risks. LDL below 70 is good for people who have very high risks.  HDL above 40 is good. It is best to have HDL of 60 or higher.  Triglycerides below 150. How can I lower my cholesterol? Diet Follow your diet program as told by your doctor.  Choose fish, white meat chicken, or [place] that is roasted or baked. Try not to eat red meat, fried foods, sausage, or lunch meats.  Eat lots of fresh fruits and vegetables.  Choose whole grains, beans, pasta, potatoes, and cereals.  Choose olive oil, corn oil, or canola oil. Only use small amounts.  Try not to eat butter, mayonnaise, shortening, or palm kernel oils.  Try not to eat foods with trans fats.  Choose low-fat or nonfat dairy foods. ? Drink skim or nonfat milk. ? Eat low-fat or nonfat yogurt and cheeses. ? Try not to drink whole milk or cream. ? Try not to eat ice cream, egg yolks, or full-fat cheeses.  Healthy desserts include angel food cake, ginger snaps, animal crackers, hard candy, popsicles, and low-fat or nonfat frozen yogurt. Try not to eat pastries, cakes, pies, and cookies.  Exercise Follow your  exercise program as told by your doctor.  Be more active. Try gardening, walking, and taking the stairs.  Ask your doctor about ways that you can be more active.  Medicine  Take over-the-counter and prescription medicines only as told by your doctor.  This information is not intended to replace advice given to you by your health care provider. Make sure you discuss any questions you have with your health care provider. Document Released: 07/16/2008 Document Revised: 11/19/2015 Document Reviewed: 10/30/2015 Elsevier Interactive Patient Education  2017 [employer] Hematuria, Adult Hematuria is blood in your urine. It can be caused by a bladder infection, kidney infection, prostate infection, kidney stone, or cancer of your urinary tract. Infections can usually be treated with medicine, and a kidney stone usually will pass through your urine. If neither of these is the cause of your hematuria, further workup to find out the reason may be needed. It is very important that you tell your health care provider about any blood you see in your urine, even if the blood stops without treatment or happens without causing pain. Blood in your urine that happens and then stops and then happens again can be a symptom of a very serious condition. Also, pain is not a symptom in the initial stages of many urinary cancers. Follow these instructions at home:  Drink lots of fluid, 3-4 quarts a day. If you have been diagnosed with an infection, cranberry juice is especially recommended, in addition to large amounts of water.  Avoid caffeine, tea, and carbonated beverages because they tend to irritate the bladder.  Avoid alcohol because it may irritate the prostate.  Take all  medicines as directed by your health care provider.  If you were prescribed an antibiotic medicine, finish it all even if you start to feel better.  If you have been diagnosed with a kidney stone, follow your health care provider's  instructions regarding straining your urine to catch the stone.  Empty your bladder often. Avoid holding urine for long periods of time.  After a bowel movement, women should cleanse front to back. Use each tissue only once.  Empty your bladder before and after sexual intercourse if you are a female. Contact a health care provider if:  You develop back pain.  You have a fever.  You have a feeling of sickness in your stomach (nausea) or vomiting.  Your symptoms are not better in 3 days. Return sooner if you are getting worse. Get help right away if:  You develop severe vomiting and are unable to keep the medicine down.  You develop severe back or abdominal pain despite taking your medicines.  You begin passing a large amount of blood or clots in your urine.  You feel extremely weak or faint, or you pass out. This information is not intended to replace advice given to you by your health care provider. Make sure you discuss any questions you have with your health care provider. Document Released: 04/19/2005 Document Revised: 09/25/2015 Document Reviewed: 12/18/2012 Elsevier Interactive Patient Education  2017 [place] Breast self-awareness means:  Knowing how your breasts look.  Knowing how your breasts feel.  Checking your breasts every month for changes.  Telling your doctor if you notice a change in your breasts.  Breast self-awareness allows you to notice a breast problem early while it is still small. How to do a breast self-exam One way to learn what is normal for your breasts and to check for changes is to do a breast self-exam. To do a breast self-exam: Look for Changes  1. Take off all the clothes above your waist. 2. Stand in front of a mirror in a room with good lighting. 3. Put your hands on your hips. 4. Push your hands down. 5. Look at your breasts and nipples in the mirror to see if one breast or nipple looks different than the  other. Check to see if: ? The shape of one breast is different. ? The size of one breast is different. ? There are wrinkles, dips, and bumps in one breast and not the other. 6. Look at each breast for changes in your skin, such as: ? Redness. ? Scaly areas. 7. Look for changes in your nipples, such as: ? Liquid around the nipples. ? Bleeding. ? Dimpling. ? Redness. ? A change in where the nipples are. Feel for Changes 1. Lie on your back on the floor. 2. Feel each breast. To do this, follow these steps: ? Pick a breast to feel. ? Put the arm closest to that breast above your head. ? Use your other arm to feel the nipple area of your breast. Feel the area with the pads of your three middle fingers by making small circles with your fingers. For the first circle, press lightly. For the second circle, press harder. For the third circle, press even harder. ? Keep making circles with your fingers at the light, harder, and even harder pressures as you move down your breast. Stop when you feel your ribs. ? Move your fingers a little toward the center of your body. ? Start making circles with your fingers again,  this time going up until you reach your collarbone. ? Keep making up and down circles until you reach your armpit. Remember to keep using the three pressures. ? Feel the other breast in the same way. 3. Sit or stand in the shower or tub. 4. With soapy water on your skin, feel each breast the same way you did in step 2, when you were lying on the floor. Write Down What You Find  After doing the self-exam, write down:  What is normal for each breast.  Any changes you find in each breast.  When you last had your period.  How often should I check my breasts? Check your breasts every month. If you are breastfeeding, the best time to check them is after you feed your baby or after you use a breast pump. If you get periods, the best time to check your breasts is 5-7 days after your  period is over. When should I see my doctor? See your doctor if you notice:  A change in shape or size of your breasts or nipples.  A change in the skin of your breast or nipples, such as red or scaly skin.  Unusual fluid coming from your nipples.  A lump or thick area that was not there before.  Pain in your breasts.  Anything that concerns you.  This information is not intended to replace advice given to you by your health care provider. Make sure you discuss any questions you have with your health care provider. Document Released: 10/06/2007 Document Revised: 09/25/2015 Document Reviewed: 03/09/2015 Elsevier Interactive Patient Education  2018 [place] Choices to Lower Your Triglycerides Triglycerides are a type of fat in your blood. High levels of triglycerides can increase the risk of heart disease and stroke. If your triglyceride levels are high, the foods you eat and your eating habits are very important. Choosing the right foods can help lower your triglycerides. What general guidelines do I need to follow?  Lose weight if you are overweight.  Limit or avoid alcohol.  Fill one half of your plate with vegetables and green salads.  Limit fruit to two servings a day. Choose fruit instead of juice.  Make one fourth of your plate whole grains. Look for the word "whole" as the first word in the ingredient list.  Fill one fourth of your plate with lean protein foods.  Enjoy fatty fish (such as salmon, mackerel, sardines, and tuna) three times a week.  Choose healthy fats.  Limit foods high in starch and sugar.  Eat more home-cooked food and less restaurant, buffet, and fast food.  Limit fried foods.  Cook foods using methods other than frying.  Limit saturated fats.  Check ingredient lists to avoid foods with partially hydrogenated oils (trans fats) in them. What foods can I eat? Grains Whole grains, such as whole wheat or whole grain breads, crackers,  cereals, and pasta. Unsweetened oatmeal, bulgur, barley, quinoa, or brown rice. Corn or whole wheat flour tortillas. Vegetables Fresh or frozen vegetables (raw, steamed, roasted, or grilled). Green salads. Fruits All fresh, canned (in natural juice), or frozen fruits. Meat and Other Protein Products Ground beef (85% or leaner), grass-fed beef, or beef trimmed of fat. Skinless chicken or [place]. Ground chicken or [place]. Pork trimmed of fat. All fish and seafood. Eggs. Dried beans, peas, or lentils. Unsalted nuts or seeds. Unsalted canned or dry beans. Dairy Low-fat dairy products, such as skim or 1% milk, 2% or reduced-fat cheeses, low-fat ricotta  or cottage cheese, or plain low-fat yogurt. Fats and Oils Tub margarines without trans fats. Light or reduced-fat mayonnaise and salad dressings. Avocado. Safflower, olive, or canola oils. Natural peanut or almond butter. The items listed above may not be a complete list of recommended foods or beverages. Contact your dietitian for more options. What foods are not recommended? Grains White bread. White pasta. White rice. Cornbread. Bagels, pastries, and croissants. Crackers that contain trans fat. Vegetables White potatoes. Corn. Creamed or fried vegetables. Vegetables in a cheese sauce. Fruits Dried fruits. Canned fruit in light or heavy syrup. Fruit juice. Meat and Other Protein Products Fatty cuts of meat. Ribs, chicken wings, bacon, sausage, bologna, salami, chitterlings, fatback, hot dogs, bratwurst, and packaged luncheon meats. Dairy Whole or 2% milk, cream, half-and-half, and cream cheese. Whole-fat or sweetened yogurt. Full-fat cheeses. Nondairy creamers and whipped toppings. Processed cheese, cheese spreads, or cheese curds. Sweets and Desserts Corn syrup, sugars, honey, and molasses. Candy. Jam and jelly. Syrup. Sweetened cereals. Cookies, pies, cakes, donuts, muffins, and ice cream. Fats and Oils Butter, stick margarine, lard,  shortening, ghee, or bacon fat. Coconut, palm kernel, or palm oils. Beverages Alcohol. Sweetened drinks (such as sodas, lemonade, and fruit drinks or punches). The items listed above may not be a complete list of foods and beverages to avoid. Contact your dietitian for more information. This information is not intended to replace advice given to you by your health care provider. Make sure you discuss any questions you have with your health care provider. Document Released: 02/05/2004 Document Revised: 09/25/2015 Document Reviewed: 02/21/2013 Elsevier Interactive Patient Education  2017 [employer].

## 2016-12-17 NOTE — Addendum Note (Signed)
Addended by: Doreen Beam on: 12/17/2016 03:25 PM   Modules accepted: Orders

## 2016-12-17 NOTE — Addendum Note (Signed)
Addended by: Doreen Beam on: 12/17/2016 03:36 PM   Modules accepted: Orders

## 2016-12-19 LAB — URINE CULTURE

## 2016-12-21 ENCOUNTER — Other Ambulatory Visit: Payer: Self-pay | Admitting: Adult Health

## 2016-12-21 ENCOUNTER — Telehealth: Payer: Self-pay | Admitting: Adult Health

## 2016-12-21 DIAGNOSIS — A491 Streptococcal infection, unspecified site: Secondary | ICD-10-CM

## 2016-12-21 MED ORDER — AMOXICILLIN 875 MG PO TABS: 875.0000 mg | ORAL_TABLET | Freq: Two times a day (BID) | ORAL | 0 refills | Status: AC

## 2016-12-21 NOTE — Progress Notes (Deleted)
Subjective:     Patient ID: Latasha Lopez, female   DOB: [birth year], 56 y.o.   MRN: [id number]  HPI   Review of Systems     Objective:   Physical Exam     Assessment:     ***    Plan:     ***

## 2016-12-21 NOTE — Telephone Encounter (Signed)
-----   Message from Doreen Beam, [place] sent at 12/17/2016  3:39 PM EDT ----- Will you let patient know she can schedule an appointment to  come in for fasting Hemoglobin A1C as we discussed in office /  the order is in.  Thanks   Sharyn Lull Flinchum FNP-C

## 2016-12-21 NOTE — Progress Notes (Signed)
Patient ID: Latasha Lopez, female   DOB: 1960-08-07, 56 y.o.   MRN: [id number]   Recent Results (from the past 2160 hour(s))  VITAMIN D 25 Hydroxy (Vit-D Deficiency, Fractures)     Status: None   Collection Time: 12/10/16  8:04 AM  Result Value Ref Range   Vit D, 25-Hydroxy 46.1 30.0 - 100.0 ng/mL    Comment: Vitamin D deficiency has been defined by the [place] practice guideline as a level of serum 25-OH vitamin D less than 20 ng/mL (1,2). The Endocrine Society went on to further define vitamin D insufficiency as a level between 21 and 29 ng/mL (2). 1. IOM (Institute of Medicine). 2010. Dietary reference    intakes for calcium and D. [place]: The    [employer]. 2. Holick MF, Binkley [place], Bischoff-Ferrari HA, et al.    Evaluation, treatment, and prevention of vitamin D    deficiency: an Endocrine Society clinical practice    guideline. JCEM. 2011 Jul; 96(7):1911-30.   CMP12+LP+TP+TSH+6AC+CBC/D/Plt     Status: Abnormal   Collection Time: 12/10/16  8:04 AM  Result Value Ref Range   Glucose 104 (H) 65 - 99 mg/dL    Comment: Specimen received in contact with cells. No visible hemolysis present. However GLUC may be decreased and K increased. Clinical correlation indicated.    Uric Acid 6.9 2.5 - 7.1 mg/dL    Comment:            Therapeutic target for gout patients: <6.0   BUN 13 6 - 24 mg/dL   Creatinine, Ser 0.91 0.57 - 1.00 mg/dL   GFR calc non Af Amer 70 >59 mL/min/1.73   GFR calc Af Amer 81 >59 mL/min/1.73   BUN/Creatinine Ratio 14 9 - 23   Sodium 144 134 - 144 mmol/L   Potassium 3.8 3.5 - 5.2 mmol/L    Comment: Specimen received in contact with cells. No visible hemolysis present. However GLUC may be decreased and K increased. Clinical correlation indicated.    Chloride 101 96 - 106 mmol/L   Calcium 9.3 8.7 - 10.2 mg/dL   Phosphorus 4.8 (H) 2.5 - 4.5 mg/dL   Total Protein 6.7 6.0 - 8.5 g/dL   Albumin 4.4 3.5 - 5.5  g/dL   Globulin, Total 2.3 1.5 - 4.5 g/dL   Albumin/Globulin Ratio 1.9 1.2 - 2.2   Bilirubin Total 0.6 0.0 - 1.2 mg/dL   Alkaline Phosphatase 84 39 - 117 IU/L   LDH 193 119 - 226 IU/L   AST 28 0 - 40 IU/L   ALT 28 0 - 32 IU/L   GGT 16 0 - 60 IU/L   Iron 109 27 - 159 ug/dL   Cholesterol, Total 155 100 - 199 mg/dL   Triglycerides 161 (H) 0 - 149 mg/dL   HDL 39 (L) >39 mg/dL   VLDL Cholesterol Cal 32 5 - 40 mg/dL   LDL Calculated 84 0 - 99 mg/dL   Chol/HDL Ratio 4.0 0.0 - 4.4 ratio    Comment:                                   T. Chol/HDL Ratio  Men  Women                               1/2 Avg.Risk  3.4    3.3                                   Avg.Risk  5.0    4.4                                2X Avg.Risk  9.6    7.1                                3X Avg.Risk 23.4   11.0    Estimated CHD Risk 0.8 0.0 - 1.0 times avg.    Comment:                                   T. Chol/HDL Ratio                                             Men  Women                               1/2 Avg.Risk  3.4    3.3                                   Avg.Risk  5.0    4.4                                2X Avg.Risk  9.6    7.1                                3X Avg.Risk 23.4   11.0 The CHD Risk is based on the T. Chol/HDL ratio.  Other factors affect CHD Risk such as hypertension, smoking, diabetes, severe obesity, and family history of pre- mature CHD.    TSH 3.330 0.450 - 4.500 uIU/mL   T4, Total 7.6 4.5 - 12.0 ug/dL   T3 Uptake Ratio 26 24 - 39 %   Free Thyroxine Index 2.0 1.2 - 4.9   WBC 5.4 3.4 - 10.8 x10E3/uL   RBC 5.07 3.77 - 5.28 x10E6/uL   Hemoglobin 14.0 11.1 - 15.9 g/dL   Hematocrit 41.1 34.0 - 46.6 %   MCV 81 79 - 97 fL   MCH 27.6 26.6 - 33.0 pg   MCHC 34.1 31.5 - 35.7 g/dL   RDW 14.1 12.3 - 15.4 %   Platelets 252 150 - 379 x10E3/uL   Neutrophils 53 Not Estab. %   Lymphs 33 Not Estab. %   Monocytes 10 Not Estab. %   Eos 3 Not Estab. %   Basos 1  Not Estab. %   Neutrophils Absolute 2.9 1.4 - 7.0 x10E3/uL   Lymphocytes  Absolute 1.8 0.7 - 3.1 x10E3/uL   Monocytes Absolute 0.5 0.1 - 0.9 x10E3/uL   EOS (ABSOLUTE) 0.2 0.0 - 0.4 x10E3/uL   Basophils Absolute 0.1 0.0 - 0.2 x10E3/uL   Immature Granulocytes 0 Not Estab. %   Immature Grans (Abs) 0.0 0.0 - 0.1 x10E3/uL  POCT urinalysis dipstick     Status: Abnormal   Collection Time: 12/10/16  8:37 AM  Result Value Ref Range   Color, UA YELLOW    Clarity, UA CLEAR    Glucose, UA NEG    Bilirubin, UA NEG    Ketones, UA NEG    Spec Grav, UA 1.020 1.010 - 1.025   Blood, UA TR    pH, UA 6.0 5.0 - 8.0   Protein, UA TR    Urobilinogen, UA 0.2 0.2 or 1.0 E.U./dL   Nitrite, UA NEG    Leukocytes, UA Moderate (2+) (A) Negative  Urine Culture     Status: None   Collection Time: 12/10/16  2:26 PM  Result Value Ref Range   Urine Culture, Routine Final report    Organism ID, Bacteria Comment     Comment: Mixed urogenital flora Less than 10,000 colonies/mL   POCT urinalysis dipstick     Status: Abnormal   Collection Time: 12/17/16 11:24 AM  Result Value Ref Range   Color, UA yellow    Clarity, UA cloudy    Glucose, UA neg    Bilirubin, UA neg    Ketones, UA neg    Spec Grav, UA 1.010 1.010 - 1.025   Blood, UA hemolyzed trace    pH, UA 6.0 5.0 - 8.0   Protein, UA neg    Urobilinogen, UA 0.2 0.2 or 1.0 E.U./dL   Nitrite, UA neg    Leukocytes, UA Large (3+) (A) Negative  Urine Culture     Status: Abnormal   Collection Time: 12/17/16  2:34 PM  Result Value Ref Range   Urine Culture, Routine Final report (A)    Organism ID, Bacteria Comment (A)     Comment: Beta hemolytic Streptococcus, group B 50,000-100,000 colony forming units per mL Penicillin and ampicillin are drugs of choice for treatment of beta-hemolytic streptococcal infections. Susceptibility testing of penicillins and other beta-lactam agents approved by the FDA for treatment of beta-hemolytic streptococcal infections  need not be performed routinely because nonsusceptible isolates are extremely rare in any beta-hemolytic streptococcus and have not been reported for Streptococcus pyogenes (group A). (CLSI 2011)    E - Prescribed to [facility] on file.   Meds ordered this encounter  Medications  . amoxicillin (AMOXIL) 875 MG tablet    Sig: Take 1 tablet (875 mg total) by mouth 2 (two) times daily.    Dispense:  20 tablet    Refill:  0   Will Durward Mallard MT to call and document telephone call, with above urine culture results showing Group B Strep. Will check on patient and if any new symptoms from last weeks office visit.  Advise patient to call office if needed for any medication issues, if fever develops, back pain, blood in urine, pelvic pain or any new symptom develops or current symptoms worsen. Advise Emergency room or Urgent care if symptoms worsen and office is closed. Patient will need to complete all antibiotic and report any side effects, she will need repeat urine dipstick and urine culture with sensitivity after antibiotic is complete and by no later than 01/03/17. Nursing will report any questions to provider.  No Known Allergies

## 2016-12-21 NOTE — Progress Notes (Signed)
12/21/16 8:27 urine culture received. Attempted to call patient regarding urine culture with Group B - Strep. No answer. Will prescribe Amoxicillin 875 mg take one tablet twice daily for seven days # 14 tablets. Need to come in office for repeat culture after antibiotics complete no later than two weeks. Will call patient in am to see which pharmacy.

## 2016-12-22 NOTE — Telephone Encounter (Signed)
Spoke with pt regarding labs and follow up appointment for HgB A1c.  Pt has appointment scheduled for fasting A1C on 9/4. Pt will complete urine culture repeat at this time.   Pt was advised that Group B Strep was found in her urine culture and that [facility] was prescribed and is [facility]. Pt states that she received the notification from [facility] and will pick up today. She was advised to take all 10 days of the medication as prescribed and to inform us if she has any sx or problems with the antibiotic. At this time, the pt reports no sx of urinary discomfort, bleeding, or pain with urination. Pt denies any fever.  If such sx occur after hours, please go to the ER or urgent care. Pt verbalizes understanding.

## 2016-12-28 ENCOUNTER — Encounter: Payer: Self-pay | Admitting: Adult Health

## 2016-12-28 ENCOUNTER — Telehealth: Payer: Self-pay | Admitting: Medical

## 2016-12-28 NOTE — Telephone Encounter (Signed)
Called patient , she had purchased MUcinex Fast Max to take for sinuses, however it has Phenylephrine.  Patient has a history of HTN and is on  HCTZ for treatment. Reviewed with patient she can take plain Mucinex but no  Phenylephrine or sudafed and explained reason why.She verbalizes understanding and has no more further questions.

## 2016-12-28 NOTE — Telephone Encounter (Signed)
Hi Latasha Lopez,  Here is a message we received this morning from Latasha Lopez. Please advise. Thank you so much!

## 2017-01-04 ENCOUNTER — Other Ambulatory Visit: Payer: Self-pay

## 2017-01-04 DIAGNOSIS — A491 Streptococcal infection, unspecified site: Secondary | ICD-10-CM

## 2017-01-04 DIAGNOSIS — R7309 Other abnormal glucose: Secondary | ICD-10-CM

## 2017-01-05 LAB — HEMOGLOBIN A1C
Est. average glucose Bld gHb Est-mCnc: 128 mg/dL
Hgb A1c MFr Bld: 6.1 % — ABNORMAL HIGH (ref 4.8–5.6)

## 2017-01-06 LAB — URINE CULTURE

## 2017-03-28 ENCOUNTER — Telehealth: Payer: Self-pay | Admitting: Medical

## 2017-03-29 NOTE — Telephone Encounter (Signed)
Latasha Lopez is her primary I forwarded this to her.

## 2017-03-30 NOTE — Telephone Encounter (Signed)
Diagnostic Mammogram and Ultrasound was ordered at time of physical in August and expires on 02/16/18- patient was aware and provider will reorder and paytinet should go As soon as possible.  ORDER IS in Computer for [facility] and does not expire until 01/2018/

## 2017-03-31 ENCOUNTER — Encounter: Payer: Self-pay | Admitting: Adult Health

## 2017-03-31 ENCOUNTER — Telehealth: Payer: Self-pay

## 2017-03-31 NOTE — Telephone Encounter (Signed)
Contacted patient to notify her she has an order for a mammogram and ultrasound of both breast at [facility].  No answer but left a message to call us back as soon as possible.

## 2017-04-08 ENCOUNTER — Telehealth: Payer: Self-pay | Admitting: Adult Health

## 2017-04-08 DIAGNOSIS — Z0000 Encounter for general adult medical examination without abnormal findings: Secondary | ICD-10-CM

## 2017-04-11 MED ORDER — HYDROCHLOROTHIAZIDE 25 MG PO TABS: 25.0000 mg | ORAL_TABLET | Freq: Every day | ORAL | 1 refills | Status: DC

## 2017-04-11 MED ORDER — LEVOTHYROXINE SODIUM 25 MCG PO TABS
25.0000 ug | ORAL_TABLET | Freq: Every day | ORAL | 1 refills | Status: DC
Start: 2017-04-11 — End: 2017-06-16

## 2017-04-11 NOTE — Telephone Encounter (Signed)
Patient called 04/08/17 needing refills.   She is due for labs  February and was advised to call and schedule lab appointment for fasting labs.  I have already ordered this as a future order in the computer. Orders Placed This Encounter  Procedures  . CMP12+LP+TP+TSH+6AC+CBC/D/Plt    Standing Status:   Future    Standing Expiration Date:   07/10/2017     Refills as below E- Prescribed  Meds ordered this encounter  Medications  . hydrochlorothiazide (HYDRODIURIL) 25 MG tablet    Sig: Take 1 tablet (25 mg total) by mouth daily. Labs due Feb 2019    Dispense:  30 tablet    Refill:  1    Pt needs to establish with new provider call office for further refills  . levothyroxine (SYNTHROID, LEVOTHROID) 25 MCG tablet    Sig: Take 1 tablet (25 mcg total) by mouth daily before breakfast. Labs February 2019    Dispense:  30 tablet    Refill:  1   She also previously had questions on her mammogram and ultrasound order and could not be reached by phone - she reports she had no problem scheduling this and will be going ASAP. She did not think there was a order in but it has been since previous office visit as instructed.   Will need follow up appointemet to follow up on labs if abnormal.  Advised to return to clinic for an appointment if no improvement within 72 hours or if any symptoms change or worsen. Advised ER or urgent Care if after hours or on weekend. 911 for emergency symptoms at any time.     Patient verbalized understanding of all instructions given and denies any further questions at this time.

## 2017-04-20 ENCOUNTER — Ambulatory Visit
Admission: RE | Admit: 2017-04-20 | Discharge: 2017-04-20 | Disposition: A | Discharge status: Home / Self Care | Payer: BLUE CROSS/BLUE SHIELD | Source: Ambulatory Visit | Attending: Adult Health | Admitting: Adult Health

## 2017-04-20 DIAGNOSIS — N6459 Other signs and symptoms in breast: Secondary | ICD-10-CM

## 2017-04-20 DIAGNOSIS — D1779 Benign lipomatous neoplasm of other sites: Secondary | ICD-10-CM | POA: Insufficient documentation

## 2017-05-06 ENCOUNTER — Telehealth: Payer: Self-pay

## 2017-06-01 ENCOUNTER — Other Ambulatory Visit: Payer: Self-pay

## 2017-06-01 DIAGNOSIS — Z0000 Encounter for general adult medical examination without abnormal findings: Secondary | ICD-10-CM

## 2017-06-02 ENCOUNTER — Telehealth: Payer: Self-pay

## 2017-06-02 LAB — CMP12+LP+TP+TSH+6AC+CBC/D/PLT
ALT: 30 IU/L (ref 0–32)
AST: 24 IU/L (ref 0–40)
Albumin/Globulin Ratio: 2 (ref 1.2–2.2)
Albumin: 4.2 g/dL (ref 3.5–5.5)
Alkaline Phosphatase: 81 IU/L (ref 39–117)
BUN / CREAT RATIO: 18 (ref 9–23)
BUN: 14 mg/dL (ref 6–24)
Bilirubin Total: 0.4 mg/dL (ref 0.0–1.2)
CALCIUM: 8.9 mg/dL (ref 8.7–10.2)
CHOL/HDL RATIO: 3.9 ratio (ref 0.0–4.4)
Chloride: 103 mmol/L (ref 96–106)
Cholesterol, Total: 138 mg/dL (ref 100–199)
Creatinine, Ser: 0.8 mg/dL (ref 0.57–1.00)
ESTIMATED CHD RISK: 0.8 times avg. (ref 0.0–1.0)
Free Thyroxine Index: 1.8 (ref 1.2–4.9)
GFR calc non Af Amer: 82 mL/min/{1.73_m2} (ref >59)
GFR, EST AFRICAN AMERICAN: 95 mL/min/{1.73_m2} (ref >59)
GGT: 15 IU/L (ref 0–60)
GLOBULIN, TOTAL: 2.1 g/dL (ref 1.5–4.5)
Glucose: 104 mg/dL — ABNORMAL HIGH (ref 65–99)
HDL: 35 mg/dL — ABNORMAL LOW (ref >39)
Iron: 81 ug/dL (ref 27–159)
LDH: 206 IU/L (ref 119–226)
LDL Calculated: 70 mg/dL (ref 0–99)
Phosphorus: 3.8 mg/dL (ref 2.5–4.5)
Potassium: 4.1 mmol/L (ref 3.5–5.2)
SODIUM: 144 mmol/L (ref 134–144)
T3 Uptake Ratio: 27 % (ref 24–39)
T4 TOTAL: 6.8 ug/dL (ref 4.5–12.0)
TRIGLYCERIDES: 165 mg/dL — AB (ref 0–149)
TSH: 2.93 u[IU]/mL (ref 0.450–4.500)
Total Protein: 6.3 g/dL (ref 6.0–8.5)
Uric Acid: 5.5 mg/dL (ref 2.5–7.1)
VLDL Cholesterol Cal: 33 mg/dL (ref 5–40)

## 2017-06-02 NOTE — Telephone Encounter (Signed)
Regina from [employer] [facility] called to say pt would only need CBC recollected since serum had already been resulted

## 2017-06-02 NOTE — Telephone Encounter (Signed)
Cindy from [employer] [facility] [facility] called to inform clinic there was a labeling issue on labs drawn on 1/30 and pt would need to be redrawn; notified pt of need to recollect labs and she will look at her schedule and call clinic back to schedule appt within the next week or two; pt states these are routine labs for medication refills from her PCP

## 2017-06-02 NOTE — Progress Notes (Signed)
patient was notified that labs will have to be redrawn per [facility].See managers note Juliann Pulse Harrision.

## 2017-06-07 NOTE — Progress Notes (Signed)
Patient has not been in to see me. Not sure of medication she needs refilled. Please review and let me know if I need to do any refills. TY

## 2017-06-07 NOTE — Progress Notes (Signed)
Please add an A1C to this patient labs. TY

## 2017-06-07 NOTE — Progress Notes (Signed)
She is scheduled for Friday I have added an  A1C and will look for them on Monday. TY.

## 2017-06-08 ENCOUNTER — Other Ambulatory Visit: Payer: Self-pay

## 2017-06-08 NOTE — Progress Notes (Signed)
06/08/17 needed lab orders per [facility]. Original was not resulted.  Orders Placed This Encounter  Procedures  . CBC w/Diff    Standing Status:   Future    Standing Expiration Date:   07/06/2017  . HgB A1c    Standing Status:   Future    Standing Expiration Date:   07/06/2017   I have already ordered this as a future order in the computer. Patient was not seen by provider on this date this is future lab order only for 06/10/17.

## 2017-06-10 ENCOUNTER — Other Ambulatory Visit: Payer: Self-pay

## 2017-06-15 ENCOUNTER — Other Ambulatory Visit: Payer: Self-pay

## 2017-06-16 ENCOUNTER — Other Ambulatory Visit: Payer: Self-pay | Admitting: Adult Health

## 2017-06-16 DIAGNOSIS — Z0000 Encounter for general adult medical examination without abnormal findings: Secondary | ICD-10-CM

## 2017-06-16 LAB — CBC WITH DIFFERENTIAL/PLATELET
BASOS ABS: 0.1 10*3/uL (ref 0.0–0.2)
Basos: 1 %
EOS (ABSOLUTE): 0.2 10*3/uL (ref 0.0–0.4)
Eos: 3 %
Hematocrit: 39.7 % (ref 34.0–46.6)
Hemoglobin: 13.7 g/dL (ref 11.1–15.9)
Immature Grans (Abs): 0 10*3/uL (ref 0.0–0.1)
Immature Granulocytes: 0 %
LYMPHS ABS: 1.9 10*3/uL (ref 0.7–3.1)
Lymphs: 31 %
MCH: 27.7 pg (ref 26.6–33.0)
MCHC: 34.5 g/dL (ref 31.5–35.7)
MCV: 80 fL (ref 79–97)
MONOS ABS: 0.7 10*3/uL (ref 0.1–0.9)
Monocytes: 11 %
Neutrophils Absolute: 3.4 10*3/uL (ref 1.4–7.0)
Neutrophils: 54 %
Platelets: 290 10*3/uL (ref 150–379)
RBC: 4.94 x10E6/uL (ref 3.77–5.28)
RDW: 13.9 % (ref 12.3–15.4)
WBC: 6.2 10*3/uL (ref 3.4–10.8)

## 2017-06-16 LAB — HEMOGLOBIN A1C
ESTIMATED AVERAGE GLUCOSE: 126 mg/dL
Hgb A1c MFr Bld: 6 % — ABNORMAL HIGH (ref 4.8–5.6)

## 2017-06-16 MED ORDER — HYDROCHLOROTHIAZIDE 25 MG PO TABS: 25.0000 mg | ORAL_TABLET | Freq: Every day | ORAL | 1 refills | Status: DC

## 2017-06-16 MED ORDER — LEVOTHYROXINE SODIUM 25 MCG PO TABS: 25.0000 ug | ORAL_TABLET | Freq: Every day | ORAL | 0 refills | Status: DC

## 2017-06-16 NOTE — Progress Notes (Signed)
Meds ordered this encounter  Medications  . hydrochlorothiazide (HYDRODIURIL) 25 MG tablet    Sig: Take 1 tablet (25 mg total) by mouth daily. Labs due Feb 2019    Dispense:  90 tablet    Refill:  1    Pt needs to establish with new provider call office for further refills  . levothyroxine (SYNTHROID, LEVOTHROID) 25 MCG tablet    Sig: Take 1 tablet (25 mcg total) by mouth daily before breakfast. Labs February 2019    Dispense:  90 tablet    Refill:  0    90 day refill given of the above. Nelda Severe RN will call patient and review with patient,Hemoglobin  A1 C is mildly improved continue low glucemic diet, Triglycerides are still elevated- will continue fish oil, low triglyceride  as well as low saturated fat diet and exercise. Continue food that increase HDL as discussed.  Also ordered a urine dipstick as appears that this was not done as ordered on 8/21 and would like to verify hematuria is resolved.  I have already ordered this as a future order in the computer. Orders Placed This Encounter  Procedures  . POCT urinalysis dipstick    History of Hematuria in past - needs dipstick only for blood.    Standing Status:   Future    Standing Expiration Date:   08/14/2017    Scheduling Instructions:     NEEDS urine dip to be sure hematuria in past has resolved.      Nursing will inform new set of labs due in August 2019  as well as yearly physical at that time and call the office as needed anytime. She will need a FULL EXECUTIVE PANEL and Hemoglobin A1C as well.

## 2017-06-16 NOTE — Telephone Encounter (Signed)
Attempted to contact patient to give her lab results.  Left a message to call our office asap.

## 2017-06-17 ENCOUNTER — Telehealth: Payer: Self-pay

## 2017-06-17 NOTE — Telephone Encounter (Addendum)
Spoke with patient via phone about lab result tests.  A1C down to 6 from 6.1.   Instructions given to follow fat/low saturated, low glycemic diet and continue fish oil capsules. Patient verbalizes understanding and will schedule follow up soon for August.  She will also come by our office this week to give [place] a urine sample.

## 2017-08-03 ENCOUNTER — Encounter: Payer: Self-pay | Admitting: Adult Health

## 2017-08-10 ENCOUNTER — Other Ambulatory Visit: Payer: Self-pay | Admitting: Adult Health

## 2017-08-10 MED ORDER — FLUTICASONE PROPIONATE 50 MCG/ACT NA SUSP: 2.0000 | Freq: Every day | NASAL | 2 refills | Status: DC

## 2017-08-10 NOTE — Progress Notes (Signed)
Meds ordered this encounter  Medications  . fluticasone (FLONASE) 50 MCG/ACT nasal spray    Sig: Place 2 sprays into both nostrils daily.    Dispense:  16 g    Refill:  2   Patient requested Flonase be sent to pharmacy. Sent to pharmacy see my chart message and patient to follow up if symptoms change, persist or worsen at anytime.

## 2017-10-13 ENCOUNTER — Other Ambulatory Visit: Payer: Self-pay | Admitting: Adult Health

## 2017-10-13 ENCOUNTER — Encounter (INDEPENDENT_AMBULATORY_CARE_PROVIDER_SITE_OTHER): Payer: Self-pay

## 2017-10-13 MED ORDER — LEVOTHYROXINE SODIUM 25 MCG PO TABS: 25.0000 ug | ORAL_TABLET | Freq: Every day | ORAL | 0 refills | Status: DC

## 2017-10-13 NOTE — Telephone Encounter (Signed)
Mychart message sent.

## 2018-01-10 ENCOUNTER — Other Ambulatory Visit: Payer: Self-pay

## 2018-01-10 DIAGNOSIS — Z0000 Encounter for general adult medical examination without abnormal findings: Secondary | ICD-10-CM

## 2018-01-11 ENCOUNTER — Other Ambulatory Visit: Payer: Self-pay

## 2018-01-11 DIAGNOSIS — Z0000 Encounter for general adult medical examination without abnormal findings: Secondary | ICD-10-CM

## 2018-01-12 ENCOUNTER — Other Ambulatory Visit: Payer: Self-pay | Admitting: Adult Health

## 2018-01-12 LAB — VITAMIN D 25 HYDROXY (VIT D DEFICIENCY, FRACTURES): VIT D 25 HYDROXY: 47.5 ng/mL (ref 30.0–100.0)

## 2018-01-12 LAB — CMP12+LP+TP+TSH+6AC+CBC/D/PLT
ALK PHOS: 82 IU/L (ref 39–117)
ALT: 31 IU/L (ref 0–32)
AST: 26 IU/L (ref 0–40)
Albumin/Globulin Ratio: 1.9 (ref 1.2–2.2)
Albumin: 4.1 g/dL (ref 3.5–5.5)
BASOS: 1 %
BILIRUBIN TOTAL: 0.4 mg/dL (ref 0.0–1.2)
BUN/Creatinine Ratio: 24 — ABNORMAL HIGH (ref 9–23)
BUN: 18 mg/dL (ref 6–24)
Basophils Absolute: 0 10*3/uL (ref 0.0–0.2)
CHLORIDE: 101 mmol/L (ref 96–106)
CHOL/HDL RATIO: 4.1 ratio (ref 0.0–4.4)
Calcium: 9.3 mg/dL (ref 8.7–10.2)
Cholesterol, Total: 155 mg/dL (ref 100–199)
Creatinine, Ser: 0.76 mg/dL (ref 0.57–1.00)
EOS (ABSOLUTE): 0.1 10*3/uL (ref 0.0–0.4)
EOS: 3 %
ESTIMATED CHD RISK: 0.9 times avg. (ref 0.0–1.0)
FREE THYROXINE INDEX: 2.2 (ref 1.2–4.9)
GFR calc Af Amer: 100 mL/min/{1.73_m2} (ref >59)
GFR calc non Af Amer: 87 mL/min/{1.73_m2} (ref >59)
GGT: 18 IU/L (ref 0–60)
GLUCOSE: 125 mg/dL — AB (ref 65–99)
Globulin, Total: 2.2 g/dL (ref 1.5–4.5)
HDL: 38 mg/dL — ABNORMAL LOW (ref >39)
HEMATOCRIT: 40.5 % (ref 34.0–46.6)
HEMOGLOBIN: 13.4 g/dL (ref 11.1–15.9)
IMMATURE GRANS (ABS): 0 10*3/uL (ref 0.0–0.1)
IMMATURE GRANULOCYTES: 0 %
Iron: 66 ug/dL (ref 27–159)
LDH: 167 IU/L (ref 119–226)
LDL CALC: 88 mg/dL (ref 0–99)
LYMPHS ABS: 1.9 10*3/uL (ref 0.7–3.1)
Lymphs: 34 %
MCH: 27.9 pg (ref 26.6–33.0)
MCHC: 33.1 g/dL (ref 31.5–35.7)
MCV: 84 fL (ref 79–97)
Monocytes Absolute: 0.5 10*3/uL (ref 0.1–0.9)
Monocytes: 9 %
NEUTROS ABS: 3 10*3/uL (ref 1.4–7.0)
NEUTROS PCT: 53 %
POTASSIUM: 4.1 mmol/L (ref 3.5–5.2)
Phosphorus: 4.4 mg/dL (ref 2.5–4.5)
Platelets: 282 10*3/uL (ref 150–450)
RBC: 4.81 x10E6/uL (ref 3.77–5.28)
RDW: 13.9 % (ref 12.3–15.4)
SODIUM: 142 mmol/L (ref 134–144)
T3 Uptake Ratio: 27 % (ref 24–39)
T4, Total: 8 ug/dL (ref 4.5–12.0)
TSH: 3.17 u[IU]/mL (ref 0.450–4.500)
Total Protein: 6.3 g/dL (ref 6.0–8.5)
Triglycerides: 143 mg/dL (ref 0–149)
URIC ACID: 6.4 mg/dL (ref 2.5–7.1)
VLDL CHOLESTEROL CAL: 29 mg/dL (ref 5–40)
WBC: 5.6 10*3/uL (ref 3.4–10.8)

## 2018-01-12 LAB — HGB A1C W/O EAG: Hgb A1c MFr Bld: 6.1 % — ABNORMAL HIGH (ref 4.8–5.6)

## 2018-01-12 MED ORDER — LEVOTHYROXINE SODIUM 25 MCG PO TABS: 25.0000 ug | ORAL_TABLET | Freq: Every day | ORAL | 1 refills | Status: DC

## 2018-01-12 NOTE — Progress Notes (Signed)
Pharmacy request completed for as below. No patient visit. Labs reviewed. 01/12/18 Meds ordered this encounter  Medications  . levothyroxine (SYNTHROID, LEVOTHROID) 25 MCG tablet    Sig: Take 1 tablet (25 mcg total) by mouth daily before breakfast.    Dispense:  90 tablet    Refill:  1

## 2018-01-12 NOTE — Progress Notes (Signed)
Reviewed labs. Hemoglobin A1C 6.1 - prediabetes range. Vitamin D normal. HDL 38 - will discuss ways to increased and dietary lifestyle changes to decrease glucose/ Hemoglobin A 1 C. Recheck 6 months labs. Patient is aware she is overdue for physical exam and needs to call and schedule.

## 2018-02-13 ENCOUNTER — Ambulatory Visit: Payer: Self-pay | Admitting: Medical

## 2018-02-13 ENCOUNTER — Encounter: Payer: Self-pay | Admitting: Medical

## 2018-02-13 VITALS — BP 144/88 | HR 69 | Temp 97.3°F | Resp 16 | Wt 190.2 lb

## 2018-02-13 DIAGNOSIS — J301 Allergic rhinitis due to pollen: Secondary | ICD-10-CM

## 2018-02-13 DIAGNOSIS — R7303 Prediabetes: Secondary | ICD-10-CM

## 2018-02-13 DIAGNOSIS — Z760 Encounter for issue of repeat prescription: Secondary | ICD-10-CM

## 2018-02-13 DIAGNOSIS — R7301 Impaired fasting glucose: Secondary | ICD-10-CM

## 2018-02-13 DIAGNOSIS — Z23 Encounter for immunization: Secondary | ICD-10-CM

## 2018-02-13 MED ORDER — FLUTICASONE PROPIONATE 50 MCG/ACT NA SUSP: 2.0000 | Freq: Every day | NASAL | 2 refills | Status: DC

## 2018-02-13 NOTE — Patient Instructions (Addendum)
Fish oil  3000mg / daily for triglycerides. Vit D3 1000 IU/Day.   Prediabetes Eating Plan Prediabetes-also called impaired glucose tolerance or impaired fasting glucose-is a condition that causes blood sugar (blood glucose) levels to be higher than normal. Following a healthy diet can help to keep prediabetes under control. It can also help to lower the risk of type 2 diabetes and heart disease, which are increased in people who have prediabetes. Along with regular exercise, a healthy diet:  Promotes weight loss.  Helps to control blood sugar levels.  Helps to improve the way that the body uses insulin.  What do I need to know about this eating plan?  Use the glycemic index (GI) to plan your meals. The index tells you how quickly a food will raise your blood sugar. Choose low-GI foods. These foods take a longer time to raise blood sugar.  Pay close attention to the amount of carbohydrates in the food that you eat. Carbohydrates increase blood sugar levels.  Keep track of how many calories you take in. Eating the right amount of calories will help you to achieve a healthy weight. Losing about 7 percent of your starting weight can help to prevent type 2 diabetes.  You may want to follow a Mediterranean diet. This diet includes a lot of vegetables, lean meats or fish, whole grains, fruits, and healthy oils and fats. What foods can I eat? Grains Whole grains, such as whole-wheat or whole-grain breads, crackers, cereals, and pasta. Unsweetened oatmeal. Bulgur. Barley. Quinoa. Brown rice. Corn or whole-wheat flour tortillas or taco shells. Vegetables Lettuce. Spinach. Peas. Beets. Cauliflower. Cabbage. Broccoli. Carrots. Tomatoes. Squash. Eggplant. Herbs. Peppers. Onions. Cucumbers. Brussels sprouts. Fruits Berries. Bananas. Apples. Oranges. Grapes. Papaya. Mango. Pomegranate. Kiwi. Grapefruit. Cherries. Meats and Other Protein Sources Seafood. Lean meats, such as chicken and [place] or lean  cuts of pork and beef. Tofu. Eggs. Nuts. Beans. Dairy Low-fat or fat-free dairy products, such as yogurt, cottage cheese, and cheese. Beverages Water. Tea. Coffee. Sugar-free or diet soda. Seltzer water. Milk. Milk alternatives, such as soy or almond milk. Condiments Mustard. Relish. Low-fat, low-sugar ketchup. Low-fat, low-sugar barbecue sauce. Low-fat or fat-free mayonnaise. Sweets and Desserts Sugar-free or low-fat pudding. Sugar-free or low-fat ice cream and other frozen treats. Fats and Oils Avocado. Walnuts. Olive oil. The items listed above may not be a complete list of recommended foods or beverages. Contact your dietitian for more options. What foods are not recommended? Grains Refined white flour and flour products, such as bread, pasta, snack foods, and cereals. Beverages Sweetened drinks, such as sweet iced tea and soda. Sweets and Desserts Baked goods, such as cake, cupcakes, pastries, cookies, and cheesecake. The items listed above may not be a complete list of foods and beverages to avoid. Contact your dietitian for more information. This information is not intended to replace advice given to you by your health care provider. Make sure you discuss any questions you have with your health care provider. Document Released: 09/03/2014 Document Revised: 09/25/2015 Document Reviewed: 05/15/2014 Elsevier Interactive Patient Education  2017 Elsevier Inc. Preventing Unhealthy [employer], Adult Staying at a healthy weight is important. When fat builds up in your body, you may become overweight or obese. These conditions put you at greater risk for developing certain health problems, such as heart disease, diabetes, sleeping problems, joint problems, and some cancers. Unhealthy weight gain is often the result of making unhealthy choices in what you eat. It is also a result of not getting enough exercise. You  can make changes to your lifestyle to prevent obesity and stay as healthy as  possible. What nutrition changes can be made? To maintain a healthy weight and prevent obesity:  Eat only as much as your body needs. To do this: ? Pay attention to signs that you are hungry or full. Stop eating as soon as you feel full. ? If you feel hungry, try drinking water first. Drink enough water so your urine is clear or pale yellow. ? Eat smaller portions. ? Look at serving sizes on food labels. Most foods contain more than one serving per container. ? Eat the recommended amount of calories for your gender and activity level. While most active people should eat around 2,000 calories per day, if you are trying to lose weight or are not very active, you main need to eat less calories. Talk to your health care provider or dietitian about how many calories you should eat each day.  Choose healthy foods, such as: ? Fruits and vegetables. Try to fill at least half of your plate at each meal with fruits and vegetables. ? Whole grains, such as whole wheat bread, brown rice, and quinoa. ? Lean meats, such as chicken or fish. ? Other healthy proteins, such as beans, eggs, or tofu. ? Healthy fats, such as nuts, seeds, fatty fish, and olive oil. ? Low-fat or fat-free dairy.  Check food labels and avoid food and drinks that: ? Are high in calories. ? Have added sugar. ? Are high in sodium. ? Have saturated fats or trans fats.  Limit how much you eat of the following foods: ? Prepackaged meals. ? Fast food. ? Fried foods. ? Processed meat, such as bacon, sausage, and deli meats. ? Fatty cuts of red meat and poultry with skin.  Cook foods in healthier ways, such as by baking, broiling, or grilling.  When grocery shopping, try to shop around the outside of the store. This helps you buy mostly fresh foods and avoid canned and prepackaged foods.  What lifestyle changes can be made?  Exercise at least 30 minutes 5 or more days each week. Exercising includes brisk walking, yard work,  biking, running, swimming, and team sports like basketball and soccer. Ask your health care provider which exercises are safe for you.  Do not use any products that contain nicotine or tobacco, such as cigarettes and e-cigarettes. If you need help quitting, ask your health care provider.  Limit alcohol intake to no more than 1 drink a day for nonpregnant women and 2 drinks a day for men. One drink equals 12 oz of beer, 5 oz of wine, or 1 oz of hard liquor.  Try to get 7-9 hours of sleep each night. What other changes can be made?  Keep a food and activity journal to keep track of: ? What you ate and how many calories you had. Remember to count sauces, dressings, and side dishes. ? Whether you were active, and what exercises you did. ? Your calorie, weight, and activity goals.  Check your weight regularly. Track any changes. If you notice you have gained weight, make changes to your diet or activity routine.  Avoid taking weight-loss medicines or supplements. Talk to your health care provider before starting any new medicine or supplement.  Talk to your health care provider before trying any new diet or exercise plan. Why are these changes important? Eating healthy, staying active, and having healthy habits not only help prevent obesity, they also:  Help you to  manage stress and emotions.  Help you to connect with friends and family.  Improve your self-esteem.  Improve your sleep.  Prevent long-term health problems.  What can happen if changes are not made? Being obese or overweight can cause you to develop joint or bone problems, which can make it hard for you to stay active or do activities you enjoy. Being obese or overweight also puts stress on your heart and lungs and can lead to health problems like diabetes, heart disease, and some cancers. Where to find more information: Talk with your health care provider or a dietitian about healthy eating and healthy lifestyle choices.  You may also find other information through these resources:  U.S. Department of Agriculture MyPlate: FormerBoss.no  American Heart Association: www.heart.org  Centers for Disease Control and Prevention: http://www.wolf.info/  Summary  Staying at a healthy weight is important. It helps prevent certain diseases and health problems, such as heart disease, diabetes, joint problems, sleep disorders, and some cancers.  Being obese or overweight can cause you to develop joint or bone problems, which can make it hard for you to stay active or do activities you enjoy.  You can prevent unhealthy weight gain by eating a healthy diet, exercising regularly, not smoking, limiting alcohol, and getting enough sleep.  Talk with your health care provider or a dietitian for guidance about healthy eating and healthy lifestyle choices. This information is not intended to replace advice given to you by your health care provider. Make sure you discuss any questions you have with your health care provider. Document Released: 04/20/2016 Document Revised: 05/26/2016 Document Reviewed: 05/26/2016 Elsevier Interactive Patient Education  2018 [employer]. Influenza (Flu) Vaccine (Inactivated or Recombinant): What You Need to Know 1. Why get vaccinated? Influenza ("flu") is a contagious disease that spreads around the [place] every year, usually between October and May. Flu is caused by influenza viruses, and is spread mainly by coughing, sneezing, and close contact. Anyone can get flu. Flu strikes suddenly and can last several days. Symptoms vary by age, but can include:  fever/chills  sore throat  muscle aches  fatigue  cough  headache  runny or stuffy nose  Flu can also lead to pneumonia and blood infections, and cause diarrhea and seizures in children. If you have a medical condition, such as heart or lung disease, flu can make it worse. Flu is more dangerous for some people. Infants and young  children, people 17 years of age and older, pregnant women, and people with certain health conditions or a weakened immune system are at greatest risk. Each year thousands of people in the [place] die from flu, and many more are hospitalized. Flu vaccine can:  keep you from getting flu,  make flu less severe if you do get it, and  keep you from spreading flu to your family and other people. 2. Inactivated and recombinant flu vaccines A dose of flu vaccine is recommended every flu season. Children 6 months through 53 years of age may need two doses during the same flu season. Everyone else needs only one dose each flu season. Some inactivated flu vaccines contain a very small amount of a mercury-based preservative called thimerosal. Studies have not shown thimerosal in vaccines to be harmful, but flu vaccines that do not contain thimerosal are available. There is no live flu virus in flu shots. They cannot cause the flu. There are many flu viruses, and they are always changing. Each year a new flu vaccine is  made to protect against three or four viruses that are likely to cause disease in the upcoming flu season. But even when the vaccine doesn't exactly match these viruses, it may still provide some protection. Flu vaccine cannot prevent:  flu that is caused by a virus not covered by the vaccine, or  illnesses that look like flu but are not.  It takes about 2 weeks for protection to develop after vaccination, and protection lasts through the flu season. 3. Some people should not get this vaccine Tell the person who is giving you the vaccine:  If you have any severe, life-threatening allergies. If you ever had a life-threatening allergic reaction after a dose of flu vaccine, or have a severe allergy to any part of this vaccine, you may be advised not to get vaccinated. Most, but not all, types of flu vaccine contain a small amount of egg protein.  If you ever had Guillain-Barr Syndrome  (also called GBS). Some people with a history of GBS should not get this vaccine. This should be discussed with your doctor.  If you are not feeling well. It is usually okay to get flu vaccine when you have a mild illness, but you might be asked to come back when you feel better.  4. Risks of a vaccine reaction With any medicine, including vaccines, there is a chance of reactions. These are usually mild and go away on their own, but serious reactions are also possible. Most people who get a flu shot do not have any problems with it. Minor problems following a flu shot include:  soreness, redness, or swelling where the shot was given  hoarseness  sore, red or itchy eyes  cough  fever  aches  headache  itching  fatigue  If these problems occur, they usually begin soon after the shot and last 1 or 2 days. More serious problems following a flu shot can include the following:  There may be a small increased risk of Guillain-Barre Syndrome (GBS) after inactivated flu vaccine. This risk has been estimated at 1 or 2 additional cases per million people vaccinated. This is much lower than the risk of severe complications from flu, which can be prevented by flu vaccine.  Young children who get the flu shot along with pneumococcal vaccine (PCV13) and/or DTaP vaccine at the same time might be slightly more likely to have a seizure caused by fever. Ask your doctor for more information. Tell your doctor if a child who is getting flu vaccine has ever had a seizure.  Problems that could happen after any injected vaccine:  People sometimes faint after a medical procedure, including vaccination. Sitting or lying down for about 15 minutes can help prevent fainting, and injuries caused by a fall. Tell your doctor if you feel dizzy, or have vision changes or ringing in the ears.  Some people get severe pain in the shoulder and have difficulty moving the arm where a shot was given. This happens very  rarely.  Any medication can cause a severe allergic reaction. Such reactions from a vaccine are very rare, estimated at about 1 in a million doses, and would happen within a few minutes to a few hours after the vaccination. As with any medicine, there is a very remote chance of a vaccine causing a serious injury or death. The safety of vaccines is always being monitored. For more information, visit: http://www.aguilar.org/ 5. What if there is a serious reaction? What should I look for? Look for anything  that concerns you, such as signs of a severe allergic reaction, very high fever, or unusual behavior. Signs of a severe allergic reaction can include hives, swelling of the face and throat, difficulty breathing, a fast heartbeat, dizziness, and weakness. These would start a few minutes to a few hours after the vaccination. What should I do?  If you think it is a severe allergic reaction or other emergency that can't wait, call 9-1-1 and get the person to the nearest hospital. Otherwise, call your doctor.  Reactions should be reported to the Vaccine Adverse Event Reporting System (VAERS). Your doctor should file this report, or you can do it yourself through the VAERS web site at www.vaers.SamedayNews.es, or by calling 779-640-2462. ? VAERS does not give medical advice. 6. The National Vaccine Injury Compensation Program The [employer] Vaccine Injury Compensation Program (VICP) is a federal program that was created to compensate people who may have been injured by certain vaccines. Persons who believe they may have been injured by a vaccine can learn about the program and about filing a claim by calling 226 829 8388 or visiting the [place] website at GoldCloset.com.ee. There is a time limit to file a claim for compensation. 7. How can I learn more?  Ask your healthcare provider. He or she can give you the vaccine package insert or suggest other sources of information.  Call your local  or state health department.  Contact the Centers for Disease Control and Prevention (CDC): ? Call (318)394-8836 (1-800-CDC-INFO) or ? Visit CDC's website at https://gibson.com/ Vaccine Information Statement, Inactivated Influenza Vaccine (12/07/2013) This information is not intended to replace advice given to you by your health care provider. Make sure you discuss any questions you have with your health care provider. Document Released: 02/11/2006 Document Revised: 01/08/2016 Document Reviewed: 01/08/2016 Elsevier Interactive Patient Education  2017 [employer].

## 2018-02-13 NOTE — Progress Notes (Signed)
   Subjective:    Patient ID: Latasha Lopez, female    DOB: 04/07/1961, 57 y.o.   MRN: [id number]  HPI 57 yo female in for lab review. A1C 6.1  To schedule physical exam with primary provider Laverna Peace NP. Marland KitchenWould like to get Flu Vaccine.  Blood pressure (!) 144/88, pulse 69, temperature (!) 97.3 F (36.3 C), temperature source Tympanic, resp. rate 16, weight 190 lb 3.2 oz (86.3 kg), SpO2 97 %. Review of Systems  Constitutional: Negative for chills and fatigue.  Respiratory: Negative for cough and shortness of breath.   Cardiovascular: Negative for chest pain.  Genitourinary: Negative for difficulty urinating and dysuria.  Musculoskeletal: Negative for myalgias.  Allergic/Immunologic: Positive for environmental allergies.  Neurological: Negative for dizziness, syncope and light-headedness.  Psychiatric/Behavioral: Negative for behavioral problems, self-injury and suicidal ideas.     Had knee problems over the last month , used knee brace due to catching or getting stiff, right knee, has gotten better, no swelling, no pain today.Marland Kitchen   History of breast cyst bilateral  " so they usually do an ultrasound too".due for mamogram,  12/19.  Vit D3 unsure of mg, recommended 1,000IU/Day    Had Colonoscopy Jan 2019 [facility] Hx of complete  hysterectomy 2006, Told to come as needed by the OB/GYN. Objective:   Physical Exam  Constitutional: She is oriented to person, place, and time. She appears well-developed and well-nourished.  HENT:  Head: Normocephalic and atraumatic.  Eyes: Pupils are equal, round, and reactive to light. Conjunctivae and EOM are normal.  Neck: Normal range of motion.  Cardiovascular: Normal rate, regular rhythm and normal heart sounds.  Pulmonary/Chest: Effort normal and breath sounds normal.  Neurological: She is alert and oriented to person, place, and time.  Skin: Skin is warm and dry.  Psychiatric: She has a normal mood and affect. Her behavior is normal.  Judgment and thought content normal.  Nursing note and vitals reviewed.         Assessment & Plan:  Prediabetic discussed behavioral modification with diet and exercise. Obesity  Elevated fasting glucose  Has had [facility] appointment for dietary evaluation. Flu vaccine given today.  Medication refill Meds ordered this encounter  Medications  . fluticasone (FLONASE) 50 MCG/ACT nasal spray    Sig: Place 2 sprays into both nostrils daily.    Dispense:  16 g    Refill:  2   Orders Placed This Encounter  Procedures  . Flu Vaccine QUAD 6+ mos PF IM (Fluarix Quad PF)  She will schedule physical in December due to her student Susy Frizzle is Administrator for [employer]. Patient verbalizes understanding and has no questions at discharge.

## 2018-03-01 ENCOUNTER — Other Ambulatory Visit: Payer: Self-pay | Admitting: Adult Health

## 2018-03-01 MED ORDER — HYDROCHLOROTHIAZIDE 25 MG PO TABS: 25.0000 mg | ORAL_TABLET | Freq: Every day | ORAL | 0 refills | Status: DC

## 2018-03-01 MED ORDER — LEVOTHYROXINE SODIUM 25 MCG PO TABS: 25.0000 ug | ORAL_TABLET | Freq: Every day | ORAL | 0 refills | Status: DC

## 2018-03-01 NOTE — Progress Notes (Signed)
Meds ordered this encounter  Medications  . hydrochlorothiazide (HYDRODIURIL) 25 MG tablet    Sig: Take 1 tablet (25 mg total) by mouth daily. Labs due Feb 2019    Dispense:  90 tablet    Refill:  0    Physical due.  . levothyroxine (SYNTHROID, LEVOTHROID) 25 MCG tablet    Sig: Take 1 tablet (25 mcg total) by mouth daily before breakfast.    Dispense:  90 tablet    Refill:  0  Refilled as above on 03/01/18. Patient advised she is overdue for physical- she will schedule

## 2018-03-07 DIAGNOSIS — R7303 Prediabetes: Secondary | ICD-10-CM | POA: Insufficient documentation

## 2018-04-13 NOTE — Progress Notes (Addendum)
Subjective:     Patient ID: Latasha Lopez, female   DOB: 02-17-1961, 57 y.o.   MRN: [id number]  Blood pressure (!) 162/86, pulse 83, temperature 98.3 F (36.8 C), resp. rate 16, height 5\' 1"  (1.549 m), weight 181 lb (82.1 kg), SpO2 98 %.Taking sudafed product advised to discontinue.  Sinusitis  This is a new problem. The current episode started 1 to 4 weeks ago (04/05/18 onset ). The problem has been gradually worsening since onset. There has been no fever. Her pain is at a severity of 0/10. She is experiencing no pain. Associated symptoms include congestion, coughing, ear pain, sinus pressure, sneezing and a sore throat. Pertinent negatives include no chills, diaphoresis, headaches, hoarse voice, neck pain, shortness of breath or swollen glands. Treatments tried: mucinex started wednesday night  The treatment provided mild relief.   Patient is 57 year old female in no acute distress who comes to the clinic for sinus symptoms.  Husband has been sick since thanksgiving.  Patient  denies any fever, body aches,chills, rash, chest pain, shortness of breath, nausea, vomiting, or diarrhea.   No Known Allergies   Review of Systems  Constitutional: Negative for chills and diaphoresis.  HENT: Positive for congestion, ear pain, postnasal drip, rhinorrhea, sinus pressure, sneezing and sore throat. Negative for dental problem, drooling, ear discharge, facial swelling, hearing loss, hoarse voice, mouth sores, nosebleeds, sinus pain, tinnitus, trouble swallowing and voice change.   Eyes: Positive for itching. Negative for photophobia, pain, discharge, redness and visual disturbance.  Respiratory: Positive for cough. Negative for apnea, choking, chest tightness, shortness of breath, wheezing and stridor.   Cardiovascular: Negative for chest pain, palpitations and leg swelling.  Gastrointestinal: Negative.   Genitourinary: Negative.   Musculoskeletal: Negative.  Negative for neck pain.  Skin: Negative.    Neurological: Negative.  Negative for headaches.  Hematological: Negative.   Psychiatric/Behavioral: Negative.        Objective:   Physical Exam Vitals signs reviewed.  Constitutional:      General: She is not in acute distress.    Appearance: Normal appearance. She is well-groomed and normal weight. She is not ill-appearing, toxic-appearing or diaphoretic.  HENT:     Head: Normocephalic and atraumatic.     Jaw: There is normal jaw occlusion.     Salivary Glands: Right salivary gland is not diffusely enlarged or tender. Left salivary gland is not diffusely enlarged or tender.     Right Ear: Hearing, tympanic membrane, ear canal and external ear normal. No middle ear effusion. There is no impacted cerumen. Tympanic membrane is not perforated or erythematous.     Left Ear: Hearing and external ear normal. No drainage, swelling or tenderness. A middle ear effusion (darker yellow brown fluid behind intact tympanic membrane ) is present. There is no impacted cerumen. Tympanic membrane is erythematous. Tympanic membrane is not perforated.  Eyes:     General: Lids are normal.     Extraocular Movements: Extraocular movements intact.     Conjunctiva/sclera: Conjunctivae normal.  Neck:     Musculoskeletal: Full passive range of motion without pain, normal range of motion and neck supple.     Trachea: Trachea and phonation normal.  Cardiovascular:     Rate and Rhythm: Normal rate and regular rhythm.  Pulmonary:     Effort: Pulmonary effort is normal. No accessory muscle usage or respiratory distress.     Breath sounds: Normal breath sounds and air entry. No decreased breath sounds, wheezing, rhonchi or rales.  Musculoskeletal:     Right shoulder: Normal.  Lymphadenopathy:     Head:     Right side of head: No submental, submandibular, tonsillar, preauricular, posterior auricular or occipital adenopathy.     Left side of head: No submental, submandibular, tonsillar, preauricular, posterior  auricular or occipital adenopathy.     Cervical: No cervical adenopathy.     Right cervical: No superficial, deep or posterior cervical adenopathy.    Left cervical: No superficial, deep or posterior cervical adenopathy.  Skin:    General: Skin is warm.     Capillary Refill: Capillary refill takes less than 2 seconds.     Nails: There is no clubbing.   Neurological:     Mental Status: She is alert.     GCS: GCS eye subscore is 4. GCS verbal subscore is 5. GCS motor subscore is 6.     Sensory: Sensation is intact.     Motor: Motor function is intact.     Coordination: Coordination is intact.  Psychiatric:        Attention and Perception: Attention normal.        Mood and Affect: Mood normal.        Speech: Speech normal.        Behavior: Behavior normal. Behavior is cooperative.        Thought Content: Thought content normal.        Cognition and Memory: Cognition normal.        Judgment: Judgment normal.        Assessment:     Non-recurrent acute serous otitis media of left ear  Viral upper respiratory tract infection      Plan:        Meds ordered this encounter  Medications  . amoxicillin-clavulanate (AUGMENTIN) 875-125 MG tablet    Sig: Take 1 tablet by mouth 2 (two) times daily.    Dispense:  20 tablet    Refill:  0   She is aware she is over due for physical and labs and reports will schedule in January 2020.   Advised patient call the office or your primary care doctor for an appointment if no improvement within 72 hours or if any symptoms change or worsen at any time  Advised ER or urgent Care if after hours or on weekend. Call 911 for emergency symptoms at any time.Patinet verbalized understanding of all instructions given/reviewed and treatment plan and has no further questions or concerns at this time.    Patient verbalized understanding of all instructions given and denies any further questions at this time.

## 2018-04-14 ENCOUNTER — Ambulatory Visit: Payer: Self-pay | Admitting: Adult Health

## 2018-04-14 ENCOUNTER — Encounter: Payer: Self-pay | Admitting: Adult Health

## 2018-04-14 VITALS — BP 162/86 | HR 83 | Temp 98.3°F | Resp 16 | Ht 61.0 in | Wt 181.0 lb

## 2018-04-14 DIAGNOSIS — H6502 Acute serous otitis media, left ear: Secondary | ICD-10-CM

## 2018-04-14 DIAGNOSIS — J069 Acute upper respiratory infection, unspecified: Secondary | ICD-10-CM

## 2018-04-14 MED ORDER — AMOXICILLIN-POT CLAVULANATE 875-125 MG PO TABS: 1.0000 | ORAL_TABLET | Freq: Two times a day (BID) | ORAL | 0 refills | Status: DC

## 2018-04-14 NOTE — Patient Instructions (Signed)
Otitis Media, Adult Otitis media is redness, soreness, and puffiness (swelling) in the space just behind your eardrum (middle ear). It may be caused by allergies or infection. It often happens along with a cold. Follow these instructions at home:  Take your medicine as told. Finish it even if you start to feel better.  Only take over-the-counter or prescription medicines for pain, discomfort, or fever as told by your doctor.  Follow up with your doctor as told. Contact a doctor if:  You have otitis media only in one ear, or bleeding from your nose, or both.  You notice a lump on your neck.  You are not getting better in 3-5 days.  You feel worse instead of better. Get help right away if:  You have pain that is not helped with medicine.  You have puffiness, redness, or pain around your ear.  You get a stiff neck.  You cannot move part of your face (paralysis).  You notice that the bone behind your ear hurts when you touch it. This information is not intended to replace advice given to you by your health care provider. Make sure you discuss any questions you have with your health care provider. Document Released: 10/06/2007 Document Revised: 09/25/2015 Document Reviewed: 11/14/2012 Elsevier Interactive Patient Education  2017 [employer] Amoxicillin; Clavulanic Acid tablets What is this medicine? AMOXICILLIN; CLAVULANIC ACID (a mox i SIL in; KLAV yoo lan ic AS id) is a penicillin antibiotic. It is used to treat certain kinds of bacterial infections. It will not work for colds, flu, or other viral infections. This medicine may be used for other purposes; ask your health care provider or pharmacist if you have questions. COMMON BRAND NAME(S): Augmentin What should I tell my health care provider before I take this medicine? They need to know if you have any of these conditions: -bowel disease, like colitis -kidney disease -liver disease -mononucleosis -an unusual or allergic  reaction to amoxicillin, penicillin, cephalosporin, other antibiotics, clavulanic acid, other medicines, foods, dyes, or preservatives -pregnant or trying to get pregnant -breast-feeding How should I use this medicine? Take this medicine by mouth with a full glass of water. Follow the directions on the prescription label. Take at the start of a meal. Do not crush or chew. If the tablet has a score line, you may cut it in half at the score line for easier swallowing. Take your medicine at regular intervals. Do not take your medicine more often than directed. Take all of your medicine as directed even if you think you are better. Do not skip doses or stop your medicine early. Talk to your pediatrician regarding the use of this medicine in children. Special care may be needed. Overdosage: If you think you have taken too much of this medicine contact a poison control center or emergency room at once. NOTE: This medicine is only for you. Do not share this medicine with others. What if I miss a dose? If you miss a dose, take it as soon as you can. If it is almost time for your next dose, take only that dose. Do not take double or extra doses. What may interact with this medicine? -allopurinol -anticoagulants -birth control pills -methotrexate -probenecid This list may not describe all possible interactions. Give your health care provider a list of all the medicines, herbs, non-prescription drugs, or dietary supplements you use. Also tell them if you smoke, drink alcohol, or use illegal drugs. Some items may interact with your medicine. What should  I watch for while using this medicine? Tell your doctor or health care professional if your symptoms do not improve. Do not treat diarrhea with over the counter products. Contact your doctor if you have diarrhea that lasts more than 2 days or if it is severe and watery. If you have diabetes, you may get a false-positive result for sugar in your urine. Check  with your doctor or health care professional. Birth control pills may not work properly while you are taking this medicine. Talk to your doctor about using an extra method of birth control. What side effects may I notice from receiving this medicine? Side effects that you should report to your doctor or health care professional as soon as possible: -allergic reactions like skin rash, itching or hives, swelling of the face, lips, or tongue -breathing problems -dark urine -fever or chills, sore throat -redness, blistering, peeling or loosening of the skin, including inside the mouth -seizures -trouble passing urine or change in the amount of urine -unusual bleeding, bruising -unusually weak or tired -white patches or sores in the mouth or throat Side effects that usually do not require medical attention (report to your doctor or health care professional if they continue or are bothersome): -diarrhea -dizziness -headache -nausea, vomiting -stomach upset -vaginal or anal irritation This list may not describe all possible side effects. Call your doctor for medical advice about side effects. You may report side effects to FDA at 1-800-FDA-1088. Where should I keep my medicine? Keep out of the reach of children. Store at room temperature below 25 degrees C (77 degrees F). Keep container tightly closed. Throw away any unused medicine after the expiration date. NOTE: This sheet is a summary. It may not cover all possible information. If you have questions about this medicine, talk to your doctor, pharmacist, or health care provider.  2018 Elsevier/Gold Standard (2007-07-13 12:04:30)

## 2018-05-01 IMAGING — US US BREAST*R* LIMITED INC AXILLA
1 series · 7 of 7 positions shown · non-contrast
Comparison: Previous exam(s).

CLINICAL DATA: 2 x 2 cm nodular area fixed at approximately the 3
o'clock position of the right breast at recent physical examination.
The patient reports that this has been present for multiple years
and is unchanged. Multiple small nodular areas were also felt in
approximately the 5 o'clock position of the left breast on the
recent physical examination. The patient does not feel those nodules
today.

EXAM:
2D DIGITAL DIAGNOSTIC BILATERAL MAMMOGRAM WITH CAD AND ADJUNCT TOMO
ULTRASOUND BILATERAL BREAST

[Series 1: us breast*right* limited inc axilla · 0.07mm/px · 7 of 7 slices shown]
[im 1/7]
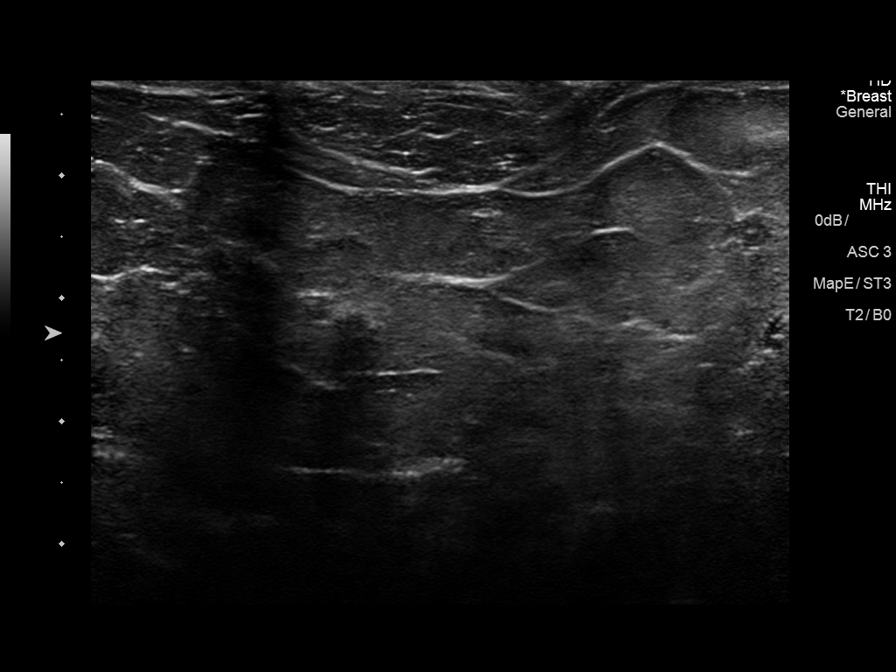
[im 2/7]
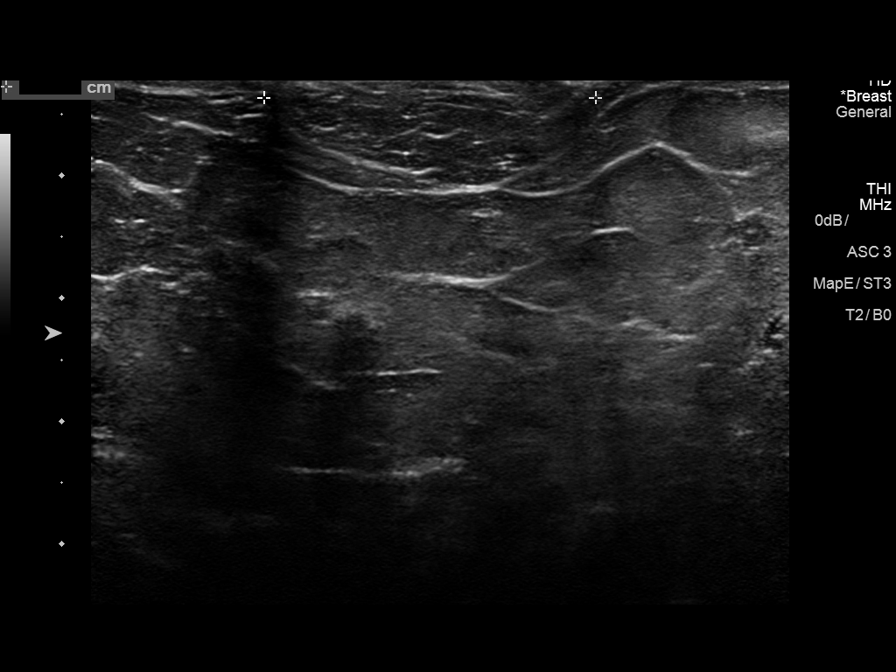
[im 3/7]
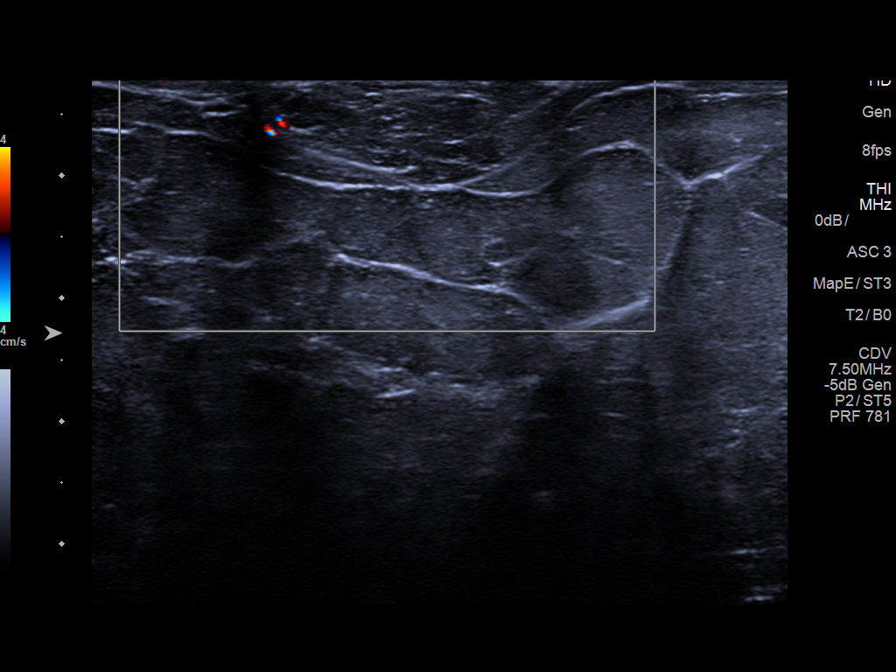
[im 4/7]
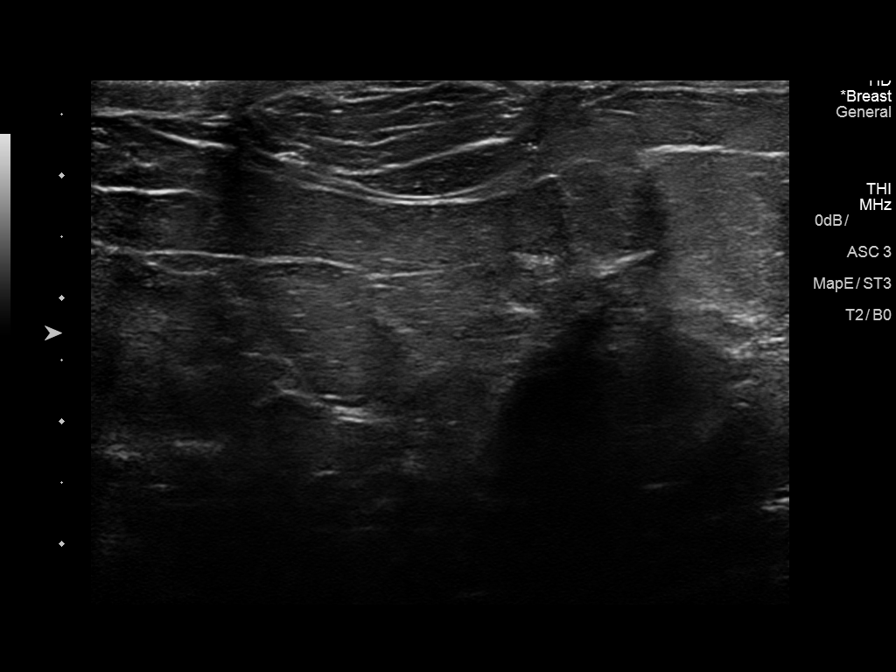
[im 5/7]
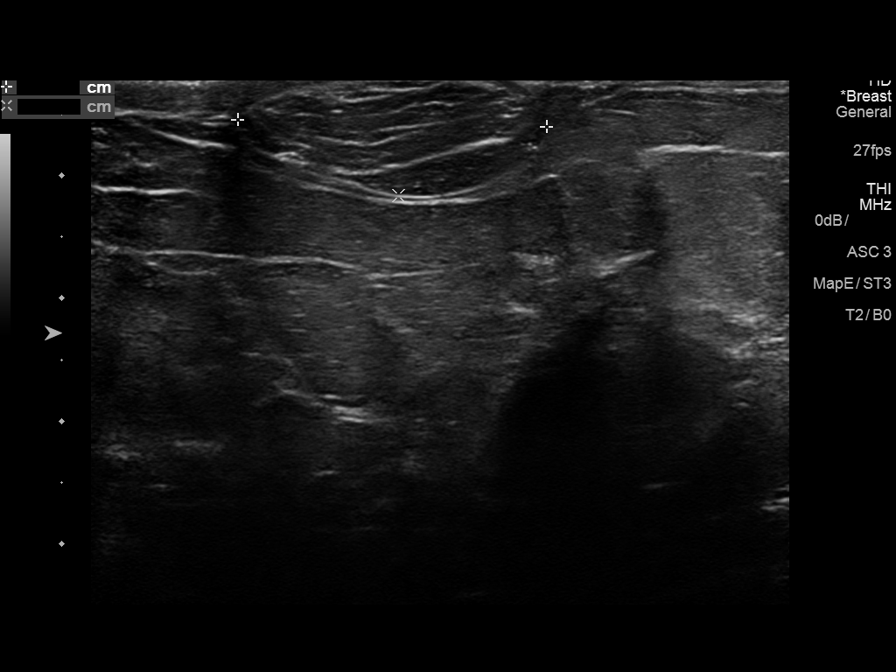
[im 6/7]
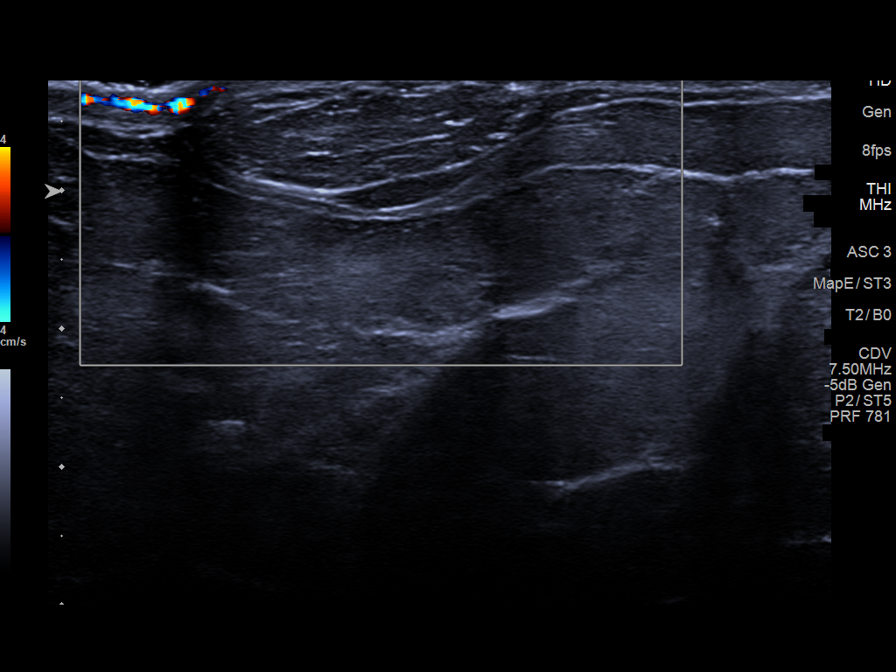
[im 7/7]
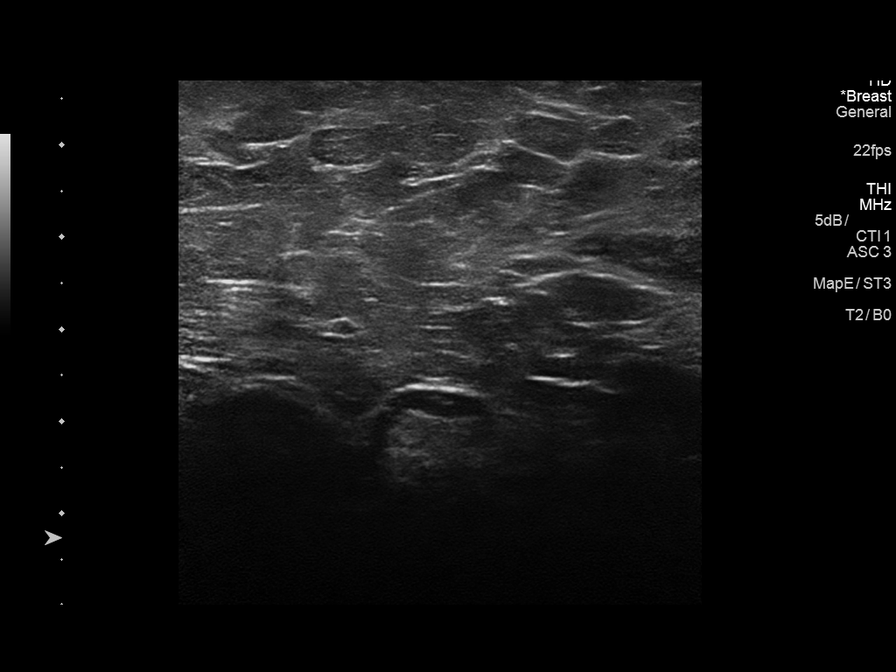

[7 of 7 positions shown; findings below may reference images not displayed]

ACR Breast Density Category b: There are scattered areas of
fibroglandular density.
FINDINGS: There is an oval, circumscribed, fat density mass just beneath the
skin at the location of the palpable mass in the 3 o'clock position
of the right breast, marked with a metallic marker.

On the initial images, there was a suggestion of an area of
architectural distortion in the lower outer quadrant of the right
breast. However, this did not persist on 2D and 3D true lateral and
spot compression views. On those views, normal appearing breast
tissue is demonstrated with crossing ligaments.

No abnormality is demonstrated on the left.

Mammographic images were processed with CAD.

On physical exam, the patient has an approximately 2 cm rounded,
protuberant, soft, palpable mass in the 3:30 o'clock position of the
left breast, 4 cm from the nipple. There is no palpable abnormality
in the lower outer right breast. In the 5 o'clock position of the
left breast, there are multiple small, rounded and oval areas of
circumscribed nodularity. These are somewhat soft to palpation.

Targeted ultrasound is performed, showing a 2.7 x 2.5 x 1.0 cm oval,
horizontally oriented, circumscribed, medium echotexture mass
containing thin internal septations in the 3:30 o'clock position of
the left breast, 4 cm from the nipple. This is located just beneath
the skin and corresponds to the protuberant mass. This has an
echotexture similar to adjacent fat lobules.

No abnormality is demonstrated in the lower outer right breast.
Ultrasound of the right axilla demonstrated normal appearing right
axillary lymph nodes.

Normal appearing rounded and oval fat lobules are demonstrated in
the lower outer left breast, corresponding to the multiple small,
rounded and oval areas of palpable nodularity. No masses were seen.
IMPRESSION: 1. 2.7 cm palpable lipoma in the 3:30 o'clock position of the left
breast.
2. Normal appearing fat lobules in the 5 o'clock position of the
left breast, corresponding to the areas of palpable nodularity.
3. No evidence of malignancy.

RECOMMENDATION:
Bilateral screening mammogram in 1 year.

I have discussed the findings and recommendations with the patient.
Results were also provided in writing at the conclusion of the
visit. If applicable, a reminder letter will be sent to the patient
regarding the next appointment.

BI-RADS CATEGORY  2: Benign.

## 2018-05-30 ENCOUNTER — Other Ambulatory Visit: Payer: Self-pay | Admitting: Medical

## 2018-05-30 ENCOUNTER — Other Ambulatory Visit: Payer: Self-pay | Admitting: Adult Health

## 2018-05-30 DIAGNOSIS — Z0000 Encounter for general adult medical examination without abnormal findings: Secondary | ICD-10-CM

## 2018-05-30 NOTE — Progress Notes (Signed)
Annual labs

## 2018-09-21 ENCOUNTER — Encounter: Payer: Self-pay | Admitting: Nurse Practitioner

## 2018-09-21 ENCOUNTER — Other Ambulatory Visit: Payer: Self-pay

## 2018-09-21 ENCOUNTER — Ambulatory Visit: Payer: Self-pay | Admitting: Nurse Practitioner

## 2018-09-21 VITALS — BP 147/88 | HR 70 | Temp 97.8°F | Resp 16 | Ht 61.0 in | Wt 189.0 lb

## 2018-09-21 DIAGNOSIS — Z1231 Encounter for screening mammogram for malignant neoplasm of breast: Secondary | ICD-10-CM

## 2018-09-21 DIAGNOSIS — E039 Hypothyroidism, unspecified: Secondary | ICD-10-CM | POA: Insufficient documentation

## 2018-09-21 DIAGNOSIS — I10 Essential (primary) hypertension: Secondary | ICD-10-CM | POA: Insufficient documentation

## 2018-09-21 DIAGNOSIS — R7303 Prediabetes: Secondary | ICD-10-CM

## 2018-09-21 DIAGNOSIS — E781 Pure hyperglyceridemia: Secondary | ICD-10-CM

## 2018-09-21 MED ORDER — LEVOTHYROXINE SODIUM 25 MCG PO TABS: 25.0000 ug | ORAL_TABLET | Freq: Every day | ORAL | 1 refills | Status: DC

## 2018-09-21 MED ORDER — HYDROCHLOROTHIAZIDE 25 MG PO TABS: 25.0000 mg | ORAL_TABLET | Freq: Every day | ORAL | 0 refills | Status: DC

## 2018-09-21 NOTE — Patient Instructions (Addendum)
Nice meeting you today [place]! Continue the over the counter lotrimin cream or gold bond as needed when rash area comes Try to get active, watch carb intake, low sodium, low fat/more baked lean foods Will refill meds today that you are in need of Look for lab message in mychart when they return Please look into Tetanus vaccine and will administer next OV if you are in need of this Schedule eye exam and get mammogram Do self breast checks Return to the office in 6 months for CPE/labs (labs entered for this today)

## 2018-09-21 NOTE — Progress Notes (Signed)
   Subjective:    Patient ID: Latasha Lopez, female    DOB: 09/17/1960, 58 y.o.   MRN: [id number]  HPI Latasha Lopez is her today for chronic f/u on hypertriglyceridemia, impaired fasting glucose, hypothryoidism and HTN. She is requesting RF for blood pressure and thyroid meds today. Taking meds as directed and tolerating. Hasn't taken any meds yet this morning because she's been fasting for labs. She denies any c/p, SOB, fever, GI symptoms.   She admits she gets a rash to her left inner thigh that comes and goes but is not too bothersome. However she reports the area is very "itchy" and uses OTC gold bond products with relief.  She reports she's not had an eye exam over 2 years and wears corrective lenses with no visual disturbances or concerns. Agrees to have eyes checked by the end of the year. Last tetanus unknown but wishes to check her records. She has a hx of hysterectomy and no longer followed by gyn/takes premarin routinely Last mammogram 04/2017 and agrees to get it this summer. FH of breast ca. Does not do SBE.  Review of Systems  Constitutional: Negative for fatigue and fever.  Respiratory: Negative for shortness of breath.   Cardiovascular: Negative for chest pain.  Gastrointestinal: Negative for abdominal pain, nausea and vomiting.  Skin: Positive for rash.       Objective:   Physical Exam Vitals signs reviewed.  Constitutional:      General: She is not in acute distress.    Appearance: Normal appearance. She is well-developed. She is obese. She is not ill-appearing.  HENT:     Head: Normocephalic and atraumatic.  Neck:     Musculoskeletal: Normal range of motion and neck supple.  Cardiovascular:     Rate and Rhythm: Normal rate and regular rhythm.     Pulses: Normal pulses.     Heart sounds: Normal heart sounds.  Pulmonary:     Effort: Pulmonary effort is normal. No respiratory distress.     Breath sounds: Normal breath sounds.  Abdominal:     General: Abdomen is  flat. Bowel sounds are normal.     Palpations: Abdomen is soft.     Tenderness: There is no abdominal tenderness.  Musculoskeletal: Normal range of motion.     Right lower leg: No edema.     Left lower leg: No edema.  Skin:    General: Skin is warm and dry.     Findings: Rash present.     Comments: Left upper circular slightly erythematous macular lesion to medial aspect of thigh with some scaling.   Neurological:     General: No focal deficit present.     Mental Status: She is alert and oriented to person, place, and time.  Psychiatric:        Mood and Affect: Mood normal.           Assessment & Plan:

## 2018-09-22 LAB — CMP12+LP+TP+TSH+6AC+CBC/D/PLT
ALT: 26 IU/L (ref 0–32)
AST: 23 IU/L (ref 0–40)
Albumin/Globulin Ratio: 1.9 (ref 1.2–2.2)
Albumin: 4.5 g/dL (ref 3.8–4.9)
Alkaline Phosphatase: 92 IU/L (ref 39–117)
BUN/Creatinine Ratio: 20 (ref 9–23)
BUN: 16 mg/dL (ref 6–24)
Basophils Absolute: 0.1 10*3/uL (ref 0.0–0.2)
Basos: 1 %
Bilirubin Total: 0.3 mg/dL (ref 0.0–1.2)
Calcium: 9.4 mg/dL (ref 8.7–10.2)
Chloride: 99 mmol/L (ref 96–106)
Chol/HDL Ratio: 3.7 ratio (ref 0.0–4.4)
Cholesterol, Total: 157 mg/dL (ref 100–199)
Creatinine, Ser: 0.82 mg/dL (ref 0.57–1.00)
EOS (ABSOLUTE): 0 10*3/uL (ref 0.0–0.4)
Eos: 0 %
Estimated CHD Risk: 0.7 times avg. (ref 0.0–1.0)
Free Thyroxine Index: 2.2 (ref 1.2–4.9)
GFR calc Af Amer: 91 mL/min/{1.73_m2} (ref >59)
GFR calc non Af Amer: 79 mL/min/{1.73_m2} (ref >59)
GGT: 15 IU/L (ref 0–60)
Globulin, Total: 2.4 g/dL (ref 1.5–4.5)
Glucose: 123 mg/dL — ABNORMAL HIGH (ref 65–99)
HDL: 43 mg/dL (ref >39)
Hematocrit: 39.6 % (ref 34.0–46.6)
Hemoglobin: 13.6 g/dL (ref 11.1–15.9)
Immature Grans (Abs): 0 10*3/uL (ref 0.0–0.1)
Immature Granulocytes: 0 %
Iron: 36 ug/dL (ref 27–159)
LDH: 202 IU/L (ref 119–226)
LDL Calculated: 89 mg/dL (ref 0–99)
Lymphocytes Absolute: 2.1 10*3/uL (ref 0.7–3.1)
Lymphs: 35 %
MCH: 27.2 pg (ref 26.6–33.0)
MCHC: 34.3 g/dL (ref 31.5–35.7)
MCV: 79 fL (ref 79–97)
Monocytes Absolute: 0.5 10*3/uL (ref 0.1–0.9)
Monocytes: 9 %
Neutrophils Absolute: 3.3 10*3/uL (ref 1.4–7.0)
Neutrophils: 55 %
Phosphorus: 4.5 mg/dL — ABNORMAL HIGH (ref 3.0–4.3)
Platelets: 321 10*3/uL (ref 150–450)
Potassium: 3.8 mmol/L (ref 3.5–5.2)
RBC: 5 x10E6/uL (ref 3.77–5.28)
RDW: 12.9 % (ref 11.7–15.4)
Sodium: 140 mmol/L (ref 134–144)
T3 Uptake Ratio: 26 % (ref 24–39)
T4, Total: 8.4 ug/dL (ref 4.5–12.0)
TSH: 3.77 u[IU]/mL (ref 0.450–4.500)
Total Protein: 6.9 g/dL (ref 6.0–8.5)
Triglycerides: 124 mg/dL (ref 0–149)
Uric Acid: 6.2 mg/dL (ref 2.5–7.1)
VLDL Cholesterol Cal: 25 mg/dL (ref 5–40)
WBC: 6 10*3/uL (ref 3.4–10.8)

## 2018-09-22 LAB — HGB A1C W/O EAG: Hgb A1c MFr Bld: 5.8 % — ABNORMAL HIGH (ref 4.8–5.6)

## 2018-09-22 LAB — VITAMIN D 25 HYDROXY (VIT D DEFICIENCY, FRACTURES): Vit D, 25-Hydroxy: 49.2 ng/mL (ref 30.0–100.0)

## 2018-12-08 ENCOUNTER — Other Ambulatory Visit: Payer: Self-pay

## 2018-12-08 ENCOUNTER — Telehealth: Payer: Self-pay | Admitting: Medical

## 2018-12-08 DIAGNOSIS — F419 Anxiety disorder, unspecified: Secondary | ICD-10-CM

## 2018-12-08 DIAGNOSIS — Z636 Dependent relative needing care at home: Secondary | ICD-10-CM

## 2018-12-08 MED ORDER — HYDROXYZINE HCL 25 MG PO TABS: 25.0000 mg | ORAL_TABLET | Freq: Four times a day (QID) | ORAL | 0 refills | Status: DC | PRN

## 2018-12-08 NOTE — Progress Notes (Signed)
Permission to do telemedicine appointment given to me, by patient.  Chief complaint stress and anxiety. Patient tearful on the phone.  Started a couple of months taking care of aging parents (59 yo father and  84 yo  Mother).  Mother with aging dementia and Alzheimers.  1 week ago mother had a stroke. Parents are still  living at home on therir own, cameras in th house for safety. Mother came home on Wednesday from the hospital.  Latasha Lopez feels that she needs to be in a facility to help rehab her, however her father says he can take care of her at home, so her mother went back home. Right now they have a nurse out once a week, but are trying to increase the frequency of visits.   No loss weightloss Help during the  day on Wednesday,  and with her sister helping the next 2 weeks.   Latasha Lopez has the power of attorney for her mother. She is very worried that she may fall and break a hip. She feels her father is not capable of taking care of her mother.    Latasha Lopez had good support with husband. However she is having trouble sleeping and staying focused due to her concern for her mother. She denies any suicidal thoughts or thoughts of harming herself.   She would like something to help calm her and  to get througt this tough period with her family.She is hoping that after  2-3 days at home that her father will agree he cannot take care of Latasha Lopez mother on his own.  We reviewed anxiety and using   Hydroxyzine to help her stay calm, also reviewed the side effect of sedation. She is willing to try the medication. Meds ordered this encounter  Medications  . hydrOXYzine (ATARAX/VISTARIL) 25 MG tablet    Sig: Take 1 tablet (25 mg total) by mouth every 6 (six) hours as needed for anxiety. May cause sedation    Dispense:  30 tablet    Refill:  0   May start with 1/2 tablet if she would like to see how sedative the medication will be. Return to the office a needed. Also suggested EAP if needed.  She  verbalizes understanding and has no questions at discharge.

## 2018-12-11 ENCOUNTER — Telehealth: Payer: Self-pay | Admitting: Medical

## 2018-12-11 ENCOUNTER — Encounter: Payer: Self-pay | Admitting: Medical

## 2018-12-11 NOTE — Patient Instructions (Signed)
Living With Anxiety  After being diagnosed with an anxiety disorder, you may be relieved to know why you have felt or behaved a certain way. It is natural to also feel overwhelmed about the treatment ahead and what it will mean for your life. With care and support, you can manage this condition and recover from it. How to cope with anxiety Dealing with stress Stress is your body's reaction to life changes and events, both good and bad. Stress can last just a few hours or it can be ongoing. Stress can play a major role in anxiety, so it is important to learn both how to cope with stress and how to think about it differently. Talk with your health care provider or a counselor to learn more about stress reduction. He or she may suggest some stress reduction techniques, such as:  Music therapy. This can include creating or listening to music that you enjoy and that inspires you.  Mindfulness-based meditation. This involves being aware of your normal breaths, rather than trying to control your breathing. It can be done while sitting or walking.  Centering prayer. This is a kind of meditation that involves focusing on a word, phrase, or sacred image that is meaningful to you and that brings you peace.  Deep breathing. To do this, expand your stomach and inhale slowly through your nose. Hold your breath for 3-5 seconds. Then exhale slowly, allowing your stomach muscles to relax.  Self-talk. This is a skill where you identify thought patterns that lead to anxiety reactions and correct those thoughts.  Muscle relaxation. This involves tensing muscles then relaxing them. Choose a stress reduction technique that fits your lifestyle and personality. Stress reduction techniques take time and practice. Set aside 5-15 minutes a day to do them. Therapists can offer training in these techniques. The training may be covered by some insurance plans. Other things you can do to manage stress include:  Keeping a  stress diary. This can help you learn what triggers your stress and ways to control your response.  Thinking about how you respond to certain situations. You may not be able to control everything, but you can control your reaction.  Making time for activities that help you relax, and not feeling guilty about spending your time in this way. Therapy combined with coping and stress-reduction skills provides the best chance for successful treatment. Medicines Medicines can help ease symptoms. Medicines for anxiety include:  Anti-anxiety drugs.  Antidepressants.  Beta-blockers. Medicines may be used as the main treatment for anxiety disorder, along with therapy, or if other treatments are not working. Medicines should be prescribed by a health care provider. Relationships Relationships can play a big part in helping you recover. Try to spend more time connecting with trusted friends and family members. Consider going to couples counseling, taking family education classes, or going to family therapy. Therapy can help you and others better understand the condition. How to recognize changes in your condition Everyone has a different response to treatment for anxiety. Recovery from anxiety happens when symptoms decrease and stop interfering with your daily activities at home or work. This may mean that you will start to:  Have better concentration and focus.  Sleep better.  Be less irritable.  Have more energy.  Have improved memory. It is important to recognize when your condition is getting worse. Contact your health care provider if your symptoms interfere with home or work and you do not feel like your condition is improving. Where  to find help and support: You can get help and support from these sources:  Self-help groups.  Online and [employer].  A trusted spiritual leader.  Couples counseling.  Family education classes.  Family therapy. Follow these instructions  at home:  Eat a healthy diet that includes plenty of vegetables, fruits, whole grains, low-fat dairy products, and lean protein. Do not eat a lot of foods that are high in solid fats, added sugars, or salt.  Exercise. Most adults should do the following: ? Exercise for at least 150 minutes each week. The exercise should increase your heart rate and make you sweat (moderate-intensity exercise). ? Strengthening exercises at least twice a week.  Cut down on caffeine, tobacco, alcohol, and other potentially harmful substances.  Get the right amount and quality of sleep. Most adults need 7-9 hours of sleep each night.  Make choices that simplify your life.  Take over-the-counter and prescription medicines only as told by your health care provider.  Avoid caffeine, alcohol, and certain over-the-counter cold medicines. These may make you feel worse. Ask your pharmacist which medicines to avoid.  Keep all follow-up visits as told by your health care provider. This is important. Questions to ask your health care provider  Would I benefit from therapy?  How often should I follow up with a health care provider?  How long do I need to take medicine?  Are there any long-term side effects of my medicine?  Are there any alternatives to taking medicine? Contact a health care provider if:  You have a hard time staying focused or finishing daily tasks.  You spend many hours a day feeling worried about everyday life.  You become exhausted by worry.  You start to have headaches, feel tense, or have nausea.  You urinate more than normal.  You have diarrhea. Get help right away if:  You have a racing heart and shortness of breath.  You have thoughts of hurting yourself or others. If you ever feel like you may hurt yourself or others, or have thoughts about taking your own life, get help right away. You can go to your nearest emergency department or call:  Your local emergency services  (911 in the [place]  A suicide crisis helpline, such as the [place] at (651)077-1826. This is open 24-hours a day. Summary  Taking steps to deal with stress can help calm you.  Medicines cannot cure anxiety disorders, but they can help ease symptoms.  Family, friends, and partners can play a big part in helping you recover from an anxiety disorder. This information is not intended to replace advice given to you by your health care provider. Make sure you discuss any questions you have with your health care provider. Document Released: 04/13/2016 Document Revised: 04/01/2017 Document Reviewed: 04/13/2016 Elsevier Patient Education  2020 [employer]. Hydroxyzine capsules or tablets What is this medicine? HYDROXYZINE (hye [place] i zeen) is an antihistamine. This medicine is used to treat allergy symptoms. It is also used to treat anxiety and tension. This medicine can be used with other medicines to induce sleep before surgery. This medicine may be used for other purposes; ask your health care provider or pharmacist if you have questions. COMMON BRAND NAME(S): ANX, Atarax, Rezine, Vistaril What should I tell my health care provider before I take this medicine? They need to know if you have any of these conditions:  glaucoma  heart disease  history of irregular heartbeat  kidney disease  liver disease  lung or breathing disease, like asthma  stomach or intestine problems  thyroid disease  trouble passing urine  an unusual or allergic reaction to hydroxyzine, cetirizine, other medicines, foods, dyes or preservatives  pregnant or trying to get pregnant  breast-feeding How should I use this medicine? Take this medicine by mouth with a full glass of water. Follow the directions on the prescription label. You may take this medicine with food or on an empty stomach. Take your medicine at regular intervals. Do not take your medicine more often than  directed. Talk to your pediatrician regarding the use of this medicine in children. Special care may be needed. While this drug may be prescribed for children as young as 63 years of age for selected conditions, precautions do apply. Patients over 31 years old may have a stronger reaction and need a smaller dose. Overdosage: If you think you have taken too much of this medicine contact a poison control center or emergency room at once. NOTE: This medicine is only for you. Do not share this medicine with others. What if I miss a dose? If you miss a dose, take it as soon as you can. If it is almost time for your next dose, take only that dose. Do not take double or extra doses. What may interact with this medicine? Do not take this medicine with any of the following medications:  cisapride  dronedarone  pimozide  thioridazine This medicine may also interact with the following medications:  alcohol  antihistamines for allergy, cough, and cold  atropine  barbiturate medicines for sleep or seizures, like phenobarbital  certain antibiotics like erythromycin or clarithromycin  certain medicines for anxiety or sleep  certain medicines for bladder problems like oxybutynin, tolterodine  certain medicines for depression or psychotic disturbances  certain medicines for irregular heart beat  certain medicines for Parkinson's disease like benztropine, trihexyphenidyl  certain medicines for seizures like phenobarbital, primidone  certain medicines for stomach problems like dicyclomine, hyoscyamine  certain medicines for travel sickness like scopolamine  ipratropium  narcotic medicines for pain  other medicines that prolong the QT interval (an abnormal heart rhythm) like dofetilide This list may not describe all possible interactions. Give your health care provider a list of all the medicines, herbs, non-prescription drugs, or dietary supplements you use. Also tell them if you smoke,  drink alcohol, or use illegal drugs. Some items may interact with your medicine. What should I watch for while using this medicine? Tell your doctor or health care professional if your symptoms do not improve. You may get drowsy or dizzy. Do not drive, use machinery, or do anything that needs mental alertness until you know how this medicine affects you. Do not stand or sit up quickly, especially if you are an older patient. This reduces the risk of dizzy or fainting spells. Alcohol may interfere with the effect of this medicine. Avoid alcoholic drinks. Your mouth may get dry. Chewing sugarless gum or sucking hard candy, and drinking plenty of water may help. Contact your doctor if the problem does not go away or is severe. This medicine may cause dry eyes and blurred vision. If you wear contact lenses you may feel some discomfort. Lubricating drops may help. See your eye doctor if the problem does not go away or is severe. If you are receiving skin tests for allergies, tell your doctor you are using this medicine. What side effects may I notice from receiving this medicine? Side effects that you should report to  your doctor or health care professional as soon as possible:  allergic reactions like skin rash, itching or hives, swelling of the face, lips, or tongue  changes in vision  confusion  fast, irregular heartbeat  seizures  tremor  trouble passing urine or change in the amount of urine Side effects that usually do not require medical attention (report to your doctor or health care professional if they continue or are bothersome):  constipation  drowsiness  dry mouth  headache  tiredness This list may not describe all possible side effects. Call your doctor for medical advice about side effects. You may report side effects to FDA at 1-800-FDA-1088. Where should I keep my medicine? Keep out of the reach of children. Store at room temperature between 15 and 30 degrees C (59  and 86 degrees F). Keep container tightly closed. Throw away any unused medicine after the expiration date. NOTE: This sheet is a summary. It may not cover all possible information. If you have questions about this medicine, talk to your doctor, pharmacist, or health care provider.  2020 Elsevier/Gold Standard (2018-04-10 13:19:55)

## 2018-12-14 ENCOUNTER — Telehealth: Payer: Self-pay | Admitting: Nurse Practitioner

## 2018-12-14 ENCOUNTER — Other Ambulatory Visit: Payer: Self-pay

## 2019-01-24 ENCOUNTER — Other Ambulatory Visit: Payer: Self-pay

## 2019-01-24 ENCOUNTER — Ambulatory Visit: Payer: Self-pay

## 2019-01-24 DIAGNOSIS — Z23 Encounter for immunization: Secondary | ICD-10-CM

## 2019-01-27 ENCOUNTER — Other Ambulatory Visit: Payer: Self-pay | Admitting: Nurse Practitioner

## 2019-01-27 DIAGNOSIS — I10 Essential (primary) hypertension: Secondary | ICD-10-CM

## 2019-02-02 ENCOUNTER — Other Ambulatory Visit: Payer: Self-pay | Admitting: Medical

## 2019-02-02 DIAGNOSIS — Z636 Dependent relative needing care at home: Secondary | ICD-10-CM

## 2019-02-02 DIAGNOSIS — F419 Anxiety disorder, unspecified: Secondary | ICD-10-CM

## 2019-02-14 ENCOUNTER — Encounter: Payer: Self-pay | Admitting: Medical

## 2019-02-19 ENCOUNTER — Other Ambulatory Visit: Payer: Self-pay | Admitting: Medical

## 2019-02-19 DIAGNOSIS — Z760 Encounter for issue of repeat prescription: Secondary | ICD-10-CM

## 2019-02-19 DIAGNOSIS — I10 Essential (primary) hypertension: Secondary | ICD-10-CM

## 2019-02-19 MED ORDER — HYDROCHLOROTHIAZIDE 25 MG PO TABS: 25.0000 mg | ORAL_TABLET | Freq: Every day | ORAL | 0 refills | Status: DC

## 2019-02-19 NOTE — Progress Notes (Signed)
Needs refill before annual exam, or  she will run out of medication.

## 2019-03-06 ENCOUNTER — Encounter: Payer: Self-pay | Admitting: Medical

## 2019-03-08 ENCOUNTER — Other Ambulatory Visit: Payer: Self-pay | Admitting: Nurse Practitioner

## 2019-03-08 DIAGNOSIS — Z1231 Encounter for screening mammogram for malignant neoplasm of breast: Secondary | ICD-10-CM

## 2019-03-21 ENCOUNTER — Other Ambulatory Visit: Payer: Self-pay

## 2019-03-21 DIAGNOSIS — I10 Essential (primary) hypertension: Secondary | ICD-10-CM

## 2019-03-21 DIAGNOSIS — E039 Hypothyroidism, unspecified: Secondary | ICD-10-CM

## 2019-03-21 DIAGNOSIS — R7303 Prediabetes: Secondary | ICD-10-CM

## 2019-03-21 DIAGNOSIS — E781 Pure hyperglyceridemia: Secondary | ICD-10-CM

## 2019-03-21 DIAGNOSIS — Z0000 Encounter for general adult medical examination without abnormal findings: Secondary | ICD-10-CM

## 2019-03-21 LAB — POCT URINALYSIS DIPSTICK
Bilirubin, UA: NEGATIVE
Glucose, UA: NEGATIVE
Ketones, UA: NEGATIVE
Nitrite, UA: NEGATIVE
Protein, UA: NEGATIVE
Spec Grav, UA: 1.02 (ref 1.010–1.025)
Urobilinogen, UA: 0.2 E.U./dL
pH, UA: 6 (ref 5.0–8.0)

## 2019-03-21 NOTE — Addendum Note (Signed)
Addended by: Beckie Salts A on: 03/21/2019 08:31 AM   Modules accepted: Orders

## 2019-03-22 LAB — CMP12+LP+7AC+CBC/PLT+HB A1C
ALT: 28 IU/L (ref 0–32)
AST: 23 IU/L (ref 0–40)
Albumin/Globulin Ratio: 1.6 (ref 1.2–2.2)
Albumin: 4.4 g/dL (ref 3.8–4.9)
Alkaline Phosphatase: 90 IU/L (ref 39–117)
BUN/Creatinine Ratio: 18 (ref 9–23)
BUN: 14 mg/dL (ref 6–24)
Bilirubin Total: 0.4 mg/dL (ref 0.0–1.2)
Bilirubin, Direct: 0.13 mg/dL (ref 0.00–0.40)
Calcium: 9.4 mg/dL (ref 8.7–10.2)
Chloride: 98 mmol/L (ref 96–106)
Chol/HDL Ratio: 3.7 ratio (ref 0.0–4.4)
Cholesterol, Total: 172 mg/dL (ref 100–199)
Creatinine, Ser: 0.79 mg/dL (ref 0.57–1.00)
GFR calc Af Amer: 95 mL/min/{1.73_m2} (ref >59)
GFR calc non Af Amer: 82 mL/min/{1.73_m2} (ref >59)
GGT: 16 IU/L (ref 0–60)
Globulin, Total: 2.7 g/dL (ref 1.5–4.5)
Glucose: 129 mg/dL — ABNORMAL HIGH (ref 65–99)
HDL: 46 mg/dL (ref >39)
Hematocrit: 42.7 % (ref 34.0–46.6)
Hemoglobin: 14.8 g/dL (ref 11.1–15.9)
Hgb A1c MFr Bld: 6.1 % — ABNORMAL HIGH (ref 4.8–5.6)
Iron: 68 ug/dL (ref 27–159)
LDH: 202 IU/L (ref 119–226)
LDL Chol Calc (NIH): 98 mg/dL (ref 0–99)
MCH: 27.9 pg (ref 26.6–33.0)
MCHC: 34.7 g/dL (ref 31.5–35.7)
MCV: 80 fL (ref 79–97)
Phosphorus: 3.6 mg/dL (ref 3.0–4.3)
Platelets: 313 10*3/uL (ref 150–450)
Potassium: 3.7 mmol/L (ref 3.5–5.2)
RBC: 5.31 x10E6/uL — ABNORMAL HIGH (ref 3.77–5.28)
RDW: 13.5 % (ref 11.7–15.4)
Sodium: 141 mmol/L (ref 134–144)
Total Protein: 7.1 g/dL (ref 6.0–8.5)
Triglycerides: 158 mg/dL — ABNORMAL HIGH (ref 0–149)
Uric Acid: 6.3 mg/dL (ref 2.5–7.1)
VLDL Cholesterol Cal: 28 mg/dL (ref 5–40)
WBC: 6.6 10*3/uL (ref 3.4–10.8)

## 2019-03-22 LAB — URINALYSIS, ROUTINE W REFLEX MICROSCOPIC
Bilirubin, UA: NEGATIVE
Glucose, UA: NEGATIVE
Ketones, UA: NEGATIVE
Nitrite, UA: NEGATIVE
Protein,UA: NEGATIVE
RBC, UA: NEGATIVE
Specific Gravity, UA: 1.016 (ref 1.005–1.030)
Urobilinogen, Ur: 0.2 mg/dL (ref 0.2–1.0)
pH, UA: 6 (ref 5.0–7.5)

## 2019-03-22 LAB — MICROALBUMIN, URINE: Microalbumin, Urine: 12.1 ug/mL

## 2019-03-22 LAB — MICROSCOPIC EXAMINATION: Casts: NONE SEEN /lpf

## 2019-03-23 ENCOUNTER — Other Ambulatory Visit: Payer: Self-pay | Admitting: Nurse Practitioner

## 2019-03-23 ENCOUNTER — Encounter: Payer: Self-pay | Admitting: Nurse Practitioner

## 2019-03-23 DIAGNOSIS — N3000 Acute cystitis without hematuria: Secondary | ICD-10-CM

## 2019-03-23 LAB — URINE CULTURE

## 2019-03-23 MED ORDER — AMOXICILLIN 500 MG PO CAPS: ORAL_CAPSULE | ORAL | 0 refills | Status: DC

## 2019-03-23 NOTE — Progress Notes (Signed)
Abnormal labs from CPE done for future CPE. Sent mychart information to start treatment.

## 2019-03-24 ENCOUNTER — Encounter: Payer: Self-pay | Admitting: Nurse Practitioner

## 2019-03-27 ENCOUNTER — Ambulatory Visit: Payer: Self-pay | Admitting: Nurse Practitioner

## 2019-04-05 ENCOUNTER — Ambulatory Visit: Payer: Self-pay | Admitting: Nurse Practitioner

## 2019-04-05 ENCOUNTER — Encounter: Payer: Self-pay | Admitting: Nurse Practitioner

## 2019-04-05 ENCOUNTER — Other Ambulatory Visit: Payer: Self-pay

## 2019-04-05 VITALS — BP 142/55 | HR 69 | Temp 97.9°F | Resp 18 | Ht 61.0 in | Wt 194.0 lb

## 2019-04-05 DIAGNOSIS — Z87440 Personal history of urinary (tract) infections: Secondary | ICD-10-CM

## 2019-04-05 DIAGNOSIS — Z2821 Immunization not carried out because of patient refusal: Secondary | ICD-10-CM

## 2019-04-05 DIAGNOSIS — Z0000 Encounter for general adult medical examination without abnormal findings: Secondary | ICD-10-CM

## 2019-04-05 DIAGNOSIS — E039 Hypothyroidism, unspecified: Secondary | ICD-10-CM

## 2019-04-05 DIAGNOSIS — I10 Essential (primary) hypertension: Secondary | ICD-10-CM

## 2019-04-05 LAB — POCT URINALYSIS DIPSTICK
Appearance: NORMAL
Bilirubin, UA: NEGATIVE
Blood, UA: NEGATIVE
Glucose, UA: NEGATIVE
Ketones, UA: NEGATIVE
Leukocytes, UA: NEGATIVE
Nitrite, UA: NEGATIVE
Protein, UA: NEGATIVE
Spec Grav, UA: 1.01 (ref 1.010–1.025)
Urobilinogen, UA: 0.2 E.U./dL
pH, UA: 6 (ref 5.0–8.0)

## 2019-04-05 MED ORDER — LEVOTHYROXINE SODIUM 25 MCG PO TABS: 25.0000 ug | ORAL_TABLET | Freq: Every day | ORAL | 1 refills | Status: DC

## 2019-04-05 MED ORDER — HYDROCHLOROTHIAZIDE 25 MG PO TABS: 25.0000 mg | ORAL_TABLET | Freq: Every day | ORAL | 1 refills | Status: DC

## 2019-04-05 MED ORDER — TETANUS-DIPHTHERIA TOXOIDS TD 5-2 LFU IM INJ: 0.5000 mL | INJECTION | Freq: Once | INTRAMUSCULAR | Status: DC

## 2019-04-05 NOTE — Progress Notes (Signed)
Subjective:    Patient ID: Latasha Lopez, female    DOB: Sep 03, 1960, 58 y.o.   MRN: [id number]  HPI Latasha Lopez is here today for CPE. She on treatment for UTI with no symptoms which was found during routine labs. She has a hx of HTN, hypertriglyceridemia, hypothyroidism, and abnormal fasting DM. She admits poor eating habits and no exercise and current pandemic conditions she's gained about 4/5 lbs. Meds reviewed and taking prescribed as directed. She has not concerns today and needs refills. Interim lost mother age 8 12/14/2018 and no grief concerns. Sleeping at least 6 hours a night although she reports difficulty staying asleep. Discussed last abnormal u/a and continues antibiotic therapy and tolerating.ROS: negative denies rashes, SOB, c/p, visual disturbances, blood in stool. PHQ2: negative and GAD7:0  Last tetanus: unknown: refuses today, shingles:wishes to think about it and will revisit next OV Flu: 01/2019 Colonoscopy: 03/2017 Eyes:appt next week Dental: goes every 6 months Mammogram: 2018; scheduled 07/2019 Dermatology: hasn't had a skin check in years EKG: done today and SR FH: CV father: pacer/mother:CVA  Labs discussed and reviewed with patient today. U/a negative to complete antibiotics.   Review of Systems  Constitutional: Negative for fatigue and fever.  Respiratory: Negative for cough and shortness of breath.   Cardiovascular: Negative for chest pain and leg swelling.  Gastrointestinal: Negative for abdominal distention, abdominal pain, constipation, diarrhea, nausea and vomiting.  Skin: Negative for rash.  Psychiatric/Behavioral:       Denies anxiety/depression         Objective:   Physical Exam Vitals signs reviewed.  Constitutional:      Appearance: Normal appearance. She is well-developed. She is obese.  HENT:     Head: Normocephalic and atraumatic.     Ears:     Comments: Bilateral intact nonerthematous TM with clear fluid behind     Nose: Nose normal.    Mouth/Throat:     Mouth: Mucous membranes are moist.     Comments: Mildly injected pharnyx Eyes:     General:        Right eye: No discharge.        Left eye: No discharge.     Extraocular Movements: Extraocular movements intact.     Conjunctiva/sclera: Conjunctivae normal.     Pupils: Pupils are equal, round, and reactive to light.  Neck:     Musculoskeletal: Normal range of motion and neck supple. No muscular tenderness.     Vascular: No carotid bruit.     Comments: Negative thyroidomegaly Cardiovascular:     Rate and Rhythm: Normal rate and regular rhythm.     Pulses: Normal pulses.     Heart sounds: Normal heart sounds. No murmur.  Pulmonary:     Effort: Pulmonary effort is normal. No respiratory distress.     Breath sounds: Normal breath sounds.  Abdominal:     General: Bowel sounds are normal. There is no distension.     Palpations: Abdomen is soft. There is no mass.     Tenderness: There is no abdominal tenderness. There is no right CVA tenderness, left CVA tenderness, guarding or rebound.     Hernia: No hernia is present.  Musculoskeletal: Normal range of motion.        General: No swelling, tenderness, deformity ( ) or signs of injury.     Right lower leg: No edema.     Left lower leg: No edema.     Comments: FROM in all extremities. Grip 5/5 upper and  lower strength 5/5  Lymphadenopathy:     Cervical: No cervical adenopathy.  Skin:    General: Skin is warm and dry.  Neurological:     General: No focal deficit present.     Mental Status: She is alert and oriented to person, place, and time.     Sensory: No sensory deficit.     Motor: No weakness.     Coordination: Coordination normal.     Gait: Gait normal.     Deep Tendon Reflexes: Reflexes normal.     Comments: CNII-XII grossly intact. Rhomberg negative with normal heel/toe. Sensation intact with good proprioception  Psychiatric:        Mood and Affect: Mood normal.        Behavior: Behavior normal.         Thought Content: Thought content normal.        Judgment: Judgment normal.           Assessment & Plan:  RTC 6 mo for chronic f/u. Will includeTSH, lipid, b12, vit d, u/a

## 2019-04-05 NOTE — Patient Instructions (Addendum)
Return to the clinic in 6 months for follow up on your ongoing conditions Consider tetanus/shingles vaccine next year Keep eye appt, mammogram and consider dermatology/skin screening  Low cholesterol/low fat, carb modified diet and exercise discussed and encouraged. Use resource you got from the dietician to help you and if you would like to see her again please let me know Your meds have been sent to the pharmacy I've included education on preventing diabetes and health prevention information  Preventive Care 70-41 Years Old, Female Preventive care refers to visits with your health care provider and lifestyle choices that can promote health and wellness. This includes:  A yearly physical exam. This may also be called an annual well check.  Regular dental visits and eye exams.  Immunizations.  Screening for certain conditions.  Healthy lifestyle choices, such as eating a healthy diet, getting regular exercise, not using drugs or products that contain nicotine and tobacco, and limiting alcohol use. What can I expect for my preventive care visit? Physical exam Your health care provider will check your:  Height and weight. This may be used to calculate body mass index (BMI), which tells if you are at a healthy weight.  Heart rate and blood pressure.  Skin for abnormal spots. Counseling Your health care provider may ask you questions about your:  Alcohol, tobacco, and drug use.  Emotional well-being.  Home and relationship well-being.  Sexual activity.  Eating habits.  Work and work Statistician.  Method of birth control.  Menstrual cycle.  Pregnancy history. What immunizations do I need?   Influenza (flu) vaccine  This is recommended every year. Tetanus, diphtheria, and pertussis (Tdap) vaccine  You may need a Td booster every 10 years. Varicella (chickenpox) vaccine  You may need this if you have not been vaccinated. Zoster (shingles) vaccine  You may need  this after age 14. Measles, mumps, and rubella (MMR) vaccine  You may need at least one dose of MMR if you were born in 1957 or later. You may also need a second dose. Pneumococcal conjugate (PCV13) vaccine  You may need this if you have certain conditions and were not previously vaccinated. Pneumococcal polysaccharide (PPSV23) vaccine  You may need one or two doses if you smoke cigarettes or if you have certain conditions. Meningococcal conjugate (MenACWY) vaccine  You may need this if you have certain conditions. Hepatitis A vaccine  You may need this if you have certain conditions or if you travel or work in places where you may be exposed to hepatitis A. Hepatitis B vaccine  You may need this if you have certain conditions or if you travel or work in places where you may be exposed to hepatitis B. Haemophilus influenzae type b (Hib) vaccine  You may need this if you have certain conditions. Human papillomavirus (HPV) vaccine  If recommended by your health care provider, you may need three doses over 6 months. You may receive vaccines as individual doses or as more than one vaccine together in one shot (combination vaccines). Talk with your health care provider about the risks and benefits of combination vaccines. What tests do I need? Blood tests  Lipid and cholesterol levels. These may be checked every 5 years, or more frequently if you are over 11 years old.  Hepatitis C test.  Hepatitis B test. Screening  Lung cancer screening. You may have this screening every year starting at age 53 if you have a 30-pack-year history of smoking and currently smoke or have quit  within the past 15 years.  Colorectal cancer screening. All adults should have this screening starting at age 42 and continuing until age 38. Your health care provider may recommend screening at age 86 if you are at increased risk. You will have tests every 1-10 years, depending on your results and the type of  screening test.  Diabetes screening. This is done by checking your blood sugar (glucose) after you have not eaten for a while (fasting). You may have this done every 1-3 years.  Mammogram. This may be done every 1-2 years. Talk with your health care provider about when you should start having regular mammograms. This may depend on whether you have a family history of breast cancer.  BRCA-related cancer screening. This may be done if you have a family history of breast, ovarian, tubal, or peritoneal cancers.  Pelvic exam and Pap test. This may be done every 3 years starting at age 68. Starting at age 41, this may be done every 5 years if you have a Pap test in combination with an HPV test. Other tests  Sexually transmitted disease (STD) testing.  Bone density scan. This is done to screen for osteoporosis. You may have this scan if you are at high risk for osteoporosis. Follow these instructions at home: Eating and drinking  Eat a diet that includes fresh fruits and vegetables, whole grains, lean protein, and low-fat dairy.  Take vitamin and mineral supplements as recommended by your health care provider.  Do not drink alcohol if: ? Your health care provider tells you not to drink. ? You are pregnant, may be pregnant, or are planning to become pregnant.  If you drink alcohol: ? Limit how much you have to 0-1 drink a day. ? Be aware of how much alcohol is in your drink. In the [place] one drink equals one 12 oz bottle of beer (355 mL), one 5 oz glass of wine (148 mL), or one 1 oz glass of hard liquor (44 mL). Lifestyle  Take daily care of your teeth and gums.  Stay active. Exercise for at least 30 minutes on 5 or more days each week.  Do not use any products that contain nicotine or tobacco, such as cigarettes, e-cigarettes, and chewing tobacco. If you need help quitting, ask your health care provider.  If you are sexually active, practice safe sex. Use a condom or other form of birth  control (contraception) in order to prevent pregnancy and STIs (sexually transmitted infections).  If told by your health care provider, take low-dose aspirin daily starting at age 46. What's next?  Visit your health care provider once a year for a well check visit.  Ask your health care provider how often you should have your eyes and teeth checked.  Stay up to date on all vaccines. This information is not intended to replace advice given to you by your health care provider. Make sure you discuss any questions you have with your health care provider. Document Released: 05/16/2015 Document Revised: 12/29/2017 Document Reviewed: 12/29/2017 Elsevier Patient Education  2020 [place].  Preventing Type 2 Diabetes Mellitus Type 2 diabetes (type 2 diabetes mellitus) is a long-term (chronic) disease that affects blood sugar (glucose) levels. Normally, a hormone called insulin allows glucose to enter cells in the body. The cells use glucose for energy. In type 2 diabetes, one or both of these problems may be present:  The body does not make enough insulin.  The body does not respond properly to insulin  that it makes (insulin resistance). Insulin resistance or lack of insulin causes excess glucose to build up in the blood instead of going into cells. As a result, high blood glucose (hyperglycemia) develops, which can cause many complications. Being overweight or obese and having an inactive (sedentary) lifestyle can increase your risk for diabetes. Type 2 diabetes can be delayed or prevented by making certain nutrition and lifestyle changes. What nutrition changes can be made?    Eat healthy meals and snacks regularly. Keep a healthy snack with you for when you get hungry between meals, such as fruit or a handful of nuts.  Eat lean meats and proteins that are low in saturated fats, such as chicken, fish, egg whites, and beans. Avoid processed meats.  Eat plenty of fruits and vegetables and  plenty of grains that have not been processed (whole grains). It is recommended that you eat: ? 1?2 cups of fruit every day. ? 2?3 cups of vegetables every day. ? 6?8 oz of whole grains every day, such as oats, whole wheat, bulgur, brown rice, quinoa, and millet.  Eat low-fat dairy products, such as milk, yogurt, and cheese.  Eat foods that contain healthy fats, such as nuts, avocado, olive oil, and canola oil.  Drink water throughout the day. Avoid drinks that contain added sugar, such as soda or sweet tea.  Follow instructions from your health care provider about specific eating or drinking restrictions.  Control how much food you eat at a time (portion size). ? Check food labels to find out the serving sizes of foods. ? Use a kitchen scale to weigh amounts of foods.  Saute or steam food instead of frying it. Cook with water or broth instead of oils or butter.  Limit your intake of: ? Salt (sodium). Have no more than 1 tsp (2,400 mg) of sodium a day. If you have heart disease or high blood pressure, have less than ? tsp (1,500 mg) of sodium a day. ? Saturated fat. This is fat that is solid at room temperature, such as butter or fat on meat. What lifestyle changes can be made? Activity    Do moderate-intensity physical activity for at least 30 minutes on at least 5 days of the week, or as much as told by your health care provider.  Ask your health care provider what activities are safe for you. A mix of physical activities may be best, such as walking, swimming, cycling, and strength training.  Try to add physical activity into your day. For example: ? Park in spots that are farther away than usual, so that you walk more. For example, park in a far corner of the parking lot when you go to the office or the grocery store. ? Take a walk during your lunch break. ? Use stairs instead of elevators or escalators. Weight Loss  Lose weight as directed. Your health care provider can  determine how much weight loss is best for you and can help you lose weight safely.  If you are overweight or obese, you may be instructed to lose at least 5?7 % of your body weight. Alcohol and Tobacco    Limit alcohol intake to no more than 1 drink a day for nonpregnant women and 2 drinks a day for men. One drink equals 12 oz of beer, 5 oz of wine, or 1 oz of hard liquor.  Do not use any tobacco products, such as cigarettes, chewing tobacco, and e-cigarettes. If you need help quitting, ask your  health care provider. Work With [facility] Provider  Have your blood glucose tested regularly, as told by your health care provider.  Discuss your risk factors and how you can reduce your risk for diabetes.  Get screening tests as told by your health care provider. You may have screening tests regularly, especially if you have certain risk factors for type 2 diabetes.  Make an appointment with a diet and nutrition specialist (registered dietitian). A registered dietitian can help you make a healthy eating plan and can help you understand portion sizes and food labels. Why are these changes important?  It is possible to prevent or delay type 2 diabetes and related health problems by making lifestyle and nutrition changes.  It can be difficult to recognize signs of type 2 diabetes. The best way to avoid possible damage to your body is to take actions to prevent the disease before you develop symptoms. What can happen if changes are not made?  Your blood glucose levels may keep increasing. Having high blood glucose for a long time is dangerous. Too much glucose in your blood can damage your blood vessels, heart, kidneys, nerves, and eyes.  You may develop prediabetes or type 2 diabetes. Type 2 diabetes can lead to many chronic health problems and complications, such as: ? Heart disease. ? Stroke. ? Blindness. ? Kidney disease. ? Depression. ? Poor circulation in the feet and legs,  which could lead to surgical removal (amputation) in severe cases. Where to find support  Ask your health care provider to recommend a registered dietitian, diabetes educator, or weight loss program.  Look for local or online weight loss groups.  Join a gym, fitness club, or outdoor activity group, such as a walking club. Where to find more information To learn more about diabetes and diabetes prevention, visit:  American Diabetes Association (ADA): www.diabetes.[employer] of Diabetes and Digestive and Kidney Diseases: FindSpin.nl To learn more about healthy eating, visit:  The U.S. Department of Agriculture Scientist, research (physical sciences)), Choose My Plate: http://wiley-williams.com/  Office of Disease Prevention and Health Promotion (ODPHP), Dietary Guidelines: SurferLive.at Summary  You can reduce your risk for type 2 diabetes by increasing your physical activity, eating healthy foods, and losing weight as directed.  Talk with your health care provider about your risk for type 2 diabetes. Ask about any blood tests or screening tests that you need to have. This information is not intended to replace advice given to you by your health care provider. Make sure you discuss any questions you have with your health care provider. Document Released: 08/11/2015 Document Revised: 08/11/2018 Document Reviewed: 06/10/2015 Elsevier Patient Education  2020 [employer].

## 2019-06-28 ENCOUNTER — Other Ambulatory Visit: Payer: Self-pay | Admitting: Nurse Practitioner

## 2019-06-28 DIAGNOSIS — I10 Essential (primary) hypertension: Secondary | ICD-10-CM

## 2019-06-28 DIAGNOSIS — E039 Hypothyroidism, unspecified: Secondary | ICD-10-CM

## 2019-06-28 DIAGNOSIS — Z87440 Personal history of urinary (tract) infections: Secondary | ICD-10-CM

## 2019-06-28 NOTE — Progress Notes (Addendum)
6 mo. F/u labs

## 2019-07-23 ENCOUNTER — Ambulatory Visit
Admission: RE | Admit: 2019-07-23 | Discharge: 2019-07-23 | Disposition: A | Discharge status: Home / Self Care | Payer: BC Managed Care – PPO | Source: Ambulatory Visit | Attending: Nurse Practitioner | Admitting: Nurse Practitioner

## 2019-07-23 DIAGNOSIS — Z1231 Encounter for screening mammogram for malignant neoplasm of breast: Secondary | ICD-10-CM | POA: Insufficient documentation

## 2019-08-02 ENCOUNTER — Encounter: Payer: Self-pay | Admitting: Medical

## 2019-08-03 ENCOUNTER — Telehealth: Payer: Self-pay | Admitting: Nurse Practitioner

## 2019-08-03 MED ORDER — PREDNISONE 10 MG (21) PO TBPK: ORAL_TABLET | ORAL | 0 refills | Status: DC

## 2019-08-03 NOTE — Patient Instructions (Signed)
Latasha Lopez, I've sent the Prednisone to [facility] on [address]. Please take as directed. You can continue the hydrocortisone, just not to your face Encouraged patient to call the office if no improvement in symptoms or if symptoms change or worsen after course of planned treatment. Patient verbalized understanding of all instructions given/reviewed and has no further questions or concerns at this time.

## 2019-08-03 NOTE — Progress Notes (Signed)
   Subjective:    Patient ID: Latasha Lopez, female    DOB: 1960-10-06, 59 y.o.   MRN: [id number]  HPI Latasha Lopez is on a telehealth visit with permission to treat. She reports that she got into some poison ivy over the weekend and has a rash on her chin. It has since spread to her neck, left arm, hands and small areas to her chest. She reports she has been putting hydrocortisone on it with some relief of itching, but the rash is spreading.     Review of Systems  Skin: Positive for rash.       itching       Objective:   Physical Exam Constitutional:      General: She is not in acute distress.    Appearance: Normal appearance. She is not ill-appearing.  HENT:     Head: Normocephalic and atraumatic.  Skin:    General: Skin is warm.     Comments: Only able to visualize the area under chin that is clustered in nature with papular circular raised erythematous lesions. No visible drainage.   Neurological:     General: No focal deficit present.     Mental Status: She is alert and oriented to person, place, and time.  Psychiatric:        Mood and Affect: Mood normal.        Behavior: Behavior normal.     Comments: Calm and relaxed, good eye contact, smiles and pleasant           Assessment & Plan:

## 2019-08-07 NOTE — Telephone Encounter (Signed)
Thank you [place]. Nira Conn

## 2019-09-27 ENCOUNTER — Other Ambulatory Visit: Payer: BC Managed Care – PPO

## 2019-09-27 ENCOUNTER — Other Ambulatory Visit: Payer: Self-pay

## 2019-09-27 DIAGNOSIS — I10 Essential (primary) hypertension: Secondary | ICD-10-CM

## 2019-09-27 DIAGNOSIS — E039 Hypothyroidism, unspecified: Secondary | ICD-10-CM

## 2019-09-27 DIAGNOSIS — Z87440 Personal history of urinary (tract) infections: Secondary | ICD-10-CM

## 2019-09-27 LAB — POCT URINALYSIS DIPSTICK
Bilirubin, UA: NEGATIVE
Blood, UA: NEGATIVE
Glucose, UA: NEGATIVE
Ketones, UA: NEGATIVE
Leukocytes, UA: NEGATIVE
Nitrite, UA: NEGATIVE
Protein, UA: NEGATIVE
Spec Grav, UA: 1.015 (ref 1.010–1.025)
Urobilinogen, UA: 0.2 E.U./dL
pH, UA: 5 (ref 5.0–8.0)

## 2019-09-28 LAB — CMP12+LP+TP+TSH+6AC+CBC/D/PLT
ALT: 25 IU/L (ref 0–32)
AST: 24 IU/L (ref 0–40)
Albumin/Globulin Ratio: 2 (ref 1.2–2.2)
Albumin: 4.3 g/dL (ref 3.8–4.9)
Alkaline Phosphatase: 92 IU/L (ref 48–121)
BUN/Creatinine Ratio: 18 (ref 12–28)
BUN: 14 mg/dL (ref 8–27)
Basophils Absolute: 0.1 10*3/uL (ref 0.0–0.2)
Basos: 1 %
Bilirubin Total: 0.4 mg/dL (ref 0.0–1.2)
Calcium: 9.1 mg/dL (ref 8.7–10.3)
Chloride: 101 mmol/L (ref 96–106)
Chol/HDL Ratio: 4 ratio (ref 0.0–4.4)
Cholesterol, Total: 160 mg/dL (ref 100–199)
Creatinine, Ser: 0.8 mg/dL (ref 0.57–1.00)
EOS (ABSOLUTE): 0.1 10*3/uL (ref 0.0–0.4)
Eos: 2 %
Estimated CHD Risk: 0.9 times avg. (ref 0.0–1.0)
Free Thyroxine Index: 2 (ref 1.2–4.9)
GFR calc Af Amer: 93 mL/min/{1.73_m2} (ref >59)
GFR calc non Af Amer: 80 mL/min/{1.73_m2} (ref >59)
GGT: 16 IU/L (ref 0–60)
Globulin, Total: 2.2 g/dL (ref 1.5–4.5)
Glucose: 125 mg/dL — ABNORMAL HIGH (ref 65–99)
HDL: 40 mg/dL (ref >39)
Hematocrit: 42.4 % (ref 34.0–46.6)
Hemoglobin: 14.4 g/dL (ref 11.1–15.9)
Immature Grans (Abs): 0 10*3/uL (ref 0.0–0.1)
Immature Granulocytes: 0 %
Iron: 74 ug/dL (ref 27–159)
LDH: 202 IU/L (ref 119–226)
LDL Chol Calc (NIH): 94 mg/dL (ref 0–99)
Lymphocytes Absolute: 2.2 10*3/uL (ref 0.7–3.1)
Lymphs: 40 %
MCH: 27.2 pg (ref 26.6–33.0)
MCHC: 34 g/dL (ref 31.5–35.7)
MCV: 80 fL (ref 79–97)
Monocytes Absolute: 0.5 10*3/uL (ref 0.1–0.9)
Monocytes: 10 %
Neutrophils Absolute: 2.6 10*3/uL (ref 1.4–7.0)
Neutrophils: 47 %
Phosphorus: 3.8 mg/dL (ref 3.0–4.3)
Platelets: 295 10*3/uL (ref 150–450)
Potassium: 3.7 mmol/L (ref 3.5–5.2)
RBC: 5.29 x10E6/uL — ABNORMAL HIGH (ref 3.77–5.28)
RDW: 14 % (ref 11.7–15.4)
Sodium: 140 mmol/L (ref 134–144)
T3 Uptake Ratio: 27 % (ref 24–39)
T4, Total: 7.3 ug/dL (ref 4.5–12.0)
TSH: 2.46 u[IU]/mL (ref 0.450–4.500)
Total Protein: 6.5 g/dL (ref 6.0–8.5)
Triglycerides: 149 mg/dL (ref 0–149)
Uric Acid: 6.8 mg/dL (ref 3.0–7.2)
VLDL Cholesterol Cal: 26 mg/dL (ref 5–40)
WBC: 5.4 10*3/uL (ref 3.4–10.8)

## 2019-09-28 LAB — VITAMIN D 25 HYDROXY (VIT D DEFICIENCY, FRACTURES): Vit D, 25-Hydroxy: 50.3 ng/mL (ref 30.0–100.0)

## 2019-10-04 ENCOUNTER — Encounter: Payer: Self-pay | Admitting: Nurse Practitioner

## 2019-10-04 ENCOUNTER — Other Ambulatory Visit: Payer: Self-pay

## 2019-10-04 ENCOUNTER — Ambulatory Visit: Payer: BC Managed Care – PPO | Admitting: Nurse Practitioner

## 2019-10-04 VITALS — BP 143/73 | HR 67 | Temp 97.0°F | Resp 18 | Ht 61.0 in | Wt 194.0 lb

## 2019-10-04 DIAGNOSIS — E781 Pure hyperglyceridemia: Secondary | ICD-10-CM

## 2019-10-04 DIAGNOSIS — F419 Anxiety disorder, unspecified: Secondary | ICD-10-CM

## 2019-10-04 DIAGNOSIS — Z636 Dependent relative needing care at home: Secondary | ICD-10-CM

## 2019-10-04 DIAGNOSIS — E039 Hypothyroidism, unspecified: Secondary | ICD-10-CM

## 2019-10-04 DIAGNOSIS — Z23 Encounter for immunization: Secondary | ICD-10-CM

## 2019-10-04 DIAGNOSIS — I10 Essential (primary) hypertension: Secondary | ICD-10-CM

## 2019-10-04 MED ORDER — HYDROCHLOROTHIAZIDE 25 MG PO TABS: 25.0000 mg | ORAL_TABLET | Freq: Every day | ORAL | 1 refills | Status: DC

## 2019-10-04 MED ORDER — LEVOTHYROXINE SODIUM 25 MCG PO TABS: 25.0000 ug | ORAL_TABLET | Freq: Every day | ORAL | 1 refills | Status: DC

## 2019-10-04 NOTE — Progress Notes (Signed)
   Subjective:    Patient ID: Latasha Lopez, female    DOB: Mar 18, 1961, 59 y.o.   MRN: [id number]  HPI  Min is here today for 6 month chronic f/u. She has a hx of HTN, hypertriglyceridemia, hypothyroidism, and abnormal fasting DM. Taking meds as directed and tolerating. She has no concerns today. Labs reviewed today and improving. She has thought about tetuanus vaccine from last OV and agrees to that today, she continues to consider the Shingrix vaccine. She's had her mammogram. She reports she is trying to watch what she eats and has recently started walking but with knee issue she wears a brace. She request RF on HTN med and thyroid med. She hasn't needed the hydroxyzine for anxiety.  Labs reviewed today with [name]  Review of Systems  Constitutional: Negative for fatigue and fever.  Respiratory: Negative for cough, shortness of breath and wheezing.   Cardiovascular: Negative for chest pain.  Gastrointestinal: Negative for abdominal distention, blood in stool, diarrhea, nausea and vomiting.  Skin: Negative for rash and wound.  Psychiatric/Behavioral: The patient is not nervous/anxious.        Denies depressive or anxious symptoms       Objective:   Physical Exam Constitutional:      General: She is not in acute distress.    Appearance: Normal appearance. She is not ill-appearing.  HENT:     Head: Normocephalic and atraumatic.  Neck:     Vascular: No carotid bruit.  Cardiovascular:     Rate and Rhythm: Normal rate and regular rhythm.     Pulses: Normal pulses.     Heart sounds: Normal heart sounds.  Pulmonary:     Effort: Pulmonary effort is normal.     Breath sounds: Normal breath sounds. No wheezing or rhonchi.  Abdominal:     General: Bowel sounds are normal.     Palpations: There is no mass.     Tenderness: There is no abdominal tenderness. There is no guarding.  Musculoskeletal:     Cervical back: Normal range of motion and neck supple.  Skin:    General: Skin is  warm and dry.     Capillary Refill: Capillary refill takes 2 to 3 seconds.  Neurological:     Mental Status: She is alert and oriented to person, place, and time.  Psychiatric:        Mood and Affect: Mood normal.        Behavior: Behavior normal.           Assessment & Plan:

## 2019-10-04 NOTE — Patient Instructions (Addendum)
Latasha Lopez, I have sent your 2 requested refills to the pharmacy Please return in December for your physical Exercise as tolerated with healthy eating habits, portion control and modification excessive carbs/fats encouraged Return to the clinic as needed  Td (Tetanus, Diphtheria) Vaccine: What You Need to Know 1. Why get vaccinated? Td vaccine can prevent tetanus and diphtheria. Tetanus enters the body through cuts or wounds. Diphtheria spreads from person to person.  TETANUS (T) causes painful stiffening of the muscles. Tetanus can lead to serious health problems, including being unable to open the mouth, having trouble swallowing and breathing, or death.  DIPHTHERIA (D) can lead to difficulty breathing, heart failure, paralysis, or death. 2. Td vaccine Td is only for children 7 years and older, adolescents, and adults.  Td is usually given as a booster dose every 10 years, but it can also be given earlier after a severe and dirty wound or burn. Another vaccine, called Tdap, that protects against pertussis, also known as "whooping cough," in addition to tetanus and diphtheria, may be used instead of Td.  Td may be given at the same time as other vaccines. 3. Talk with your health care provider Tell your vaccine provider if the person getting the vaccine:  Has had an allergic reaction after a previous dose of any vaccine that protects against tetanus or diphtheria, or has any severe, life-threatening allergies.  Has ever had Guillain-Barr Syndrome (also called GBS).  Has had severe pain or swelling after a previous dose of any vaccine that protects against tetanus or diphtheria. In some cases, your health care provider may decide to postpone Td vaccination to a future visit.  People with minor illnesses, such as a cold, may be vaccinated. People who are moderately or severely ill should usually wait until they recover before getting Td vaccine.  Your health care provider can give you more  information. 4. Risks of a vaccine reaction  Pain, redness, or swelling where the shot was given, mild fever, headache, feeling tired, and nausea, vomiting, diarrhea, or stomachache sometimes happen after Td vaccine. People sometimes faint after medical procedures, including vaccination. Tell your provider if you feel dizzy or have vision changes or ringing in the ears.  As with any medicine, there is a very remote chance of a vaccine causing a severe allergic reaction, other serious injury, or death. 5. What if there is a serious problem? An allergic reaction could occur after the vaccinated person leaves the clinic. If you see signs of a severe allergic reaction (hives, swelling of the face and throat, difficulty breathing, a fast heartbeat, dizziness, or weakness), call 9-1-1 and get the person to the nearest hospital.  For other signs that concern you, call your health care provider.  Adverse reactions should be reported to the Vaccine Adverse Event Reporting System (VAERS). Your health care provider will usually file this report, or you can do it yourself. Visit the VAERS website at www.vaers.SamedayNews.es or call 214 130 2340. VAERS is only for reporting reactions, and VAERS staff do not give medical advice. 6. The National Vaccine Injury Compensation Program The [employer] Vaccine Injury Compensation Program (VICP) is a federal program that was created to compensate people who may have been injured by certain vaccines. Visit the VICP website at GoldCloset.com.ee or call (410)051-9019 to learn about the program and about filing a claim. There is a time limit to file a claim for compensation. 7. How can I learn more?  Ask your health care provider.  Call your local or  state health department.  Contact the Centers for Disease Control and Prevention (CDC): ? Call 717-512-7013 (1-800-CDC-INFO) or ? Visit CDC's website at http://hunter.com/ Vaccine Information Statement Td Vaccine  (08/02/18) This information is not intended to replace advice given to you by your health care provider. Make sure you discuss any questions you have with your health care provider. Document Revised: 09/11/2018 Document Reviewed: 08/14/2018 Elsevier Patient Education  [place].   High Cholesterol   High cholesterol is a condition in which the blood has high levels of a white, waxy, fat-like substance (cholesterol). The human body needs small amounts of cholesterol. The liver makes all the cholesterol that the body needs. Extra (excess) cholesterol comes from the food that we eat. Cholesterol is carried from the liver by the blood through the blood vessels. If you have high cholesterol, deposits (plaques) may build up on the walls of your blood vessels (arteries). Plaques make the arteries narrower and stiffer. Cholesterol plaques increase your risk for heart attack and stroke. Work with your health care provider to keep your cholesterol levels in a healthy range. What increases the risk? This condition is more likely to develop in people who:  Eat foods that are high in animal fat (saturated fat) or cholesterol.  Are overweight.  Are not getting enough exercise.  Have a family history of high cholesterol. What are the signs or symptoms? There are no symptoms of this condition. How is this diagnosed? This condition may be diagnosed from the results of a blood test.  If you are older than age 45, your health care provider may check your cholesterol every 4-6 years.  You may be checked more often if you already have high cholesterol or other risk factors for heart disease. The blood test for cholesterol measures:  "Bad" cholesterol (LDL cholesterol). This is the main type of cholesterol that causes heart disease. The desired level for LDL is less than 100.  "Good" cholesterol (HDL cholesterol). This type helps to protect against heart disease by cleaning the arteries and  carrying the LDL away. The desired level for HDL is 60 or higher.  Triglycerides. These are fats that the body can store or burn for energy. The desired number for triglycerides is lower than 150.  Total cholesterol. This is a measure of the total amount of cholesterol in your blood, including LDL cholesterol, HDL cholesterol, and triglycerides. A healthy number is less than 200. How is this treated? This condition is treated with diet changes, lifestyle changes, and medicines. Diet changes  This may include eating more whole grains, fruits, vegetables, nuts, and fish.  This may also include cutting back on red meat and foods that have a lot of added sugar. Lifestyle changes  Changes may include getting at least 40 minutes of aerobic exercise 3 times a week. Aerobic exercises include walking, biking, and swimming. Aerobic exercise along with a healthy diet can help you maintain a healthy weight.  Changes may also include quitting smoking. Medicines  Medicines are usually given if diet and lifestyle changes have failed to reduce your cholesterol to healthy levels.  Your health care provider may prescribe a statin medicine. Statin medicines have been shown to reduce cholesterol, which can reduce the risk of heart disease. Follow these instructions at home: Eating and drinking  If told by your health care provider:  Eat chicken (without skin), fish, veal, shellfish, ground [place] breast, and round or loin cuts of red meat.  Do not eat fried foods or  fatty meats, such as hot dogs and salami.  Eat plenty of fruits, such as apples.  Eat plenty of vegetables, such as broccoli, potatoes, and carrots.  Eat beans, peas, and lentils.  Eat grains such as barley, rice, couscous, and bulgur wheat.  Eat pasta without cream sauces.  Use skim or nonfat milk, and eat low-fat or nonfat yogurt and cheeses.  Do not eat or drink whole milk, cream, ice cream, egg yolks, or hard cheeses.  Do not  eat stick margarine or tub margarines that contain trans fats (also called partially hydrogenated oils).  Do not eat saturated tropical oils, such as coconut oil and palm oil.  Do not eat cakes, cookies, crackers, or other baked goods that contain trans fats.  General instructions  Exercise as directed by your health care provider. Increase your activity level with activities such as gardening, walking, and taking the stairs.  Take over-the-counter and prescription medicines only as told by your health care provider.  Do not use any products that contain nicotine or tobacco, such as cigarettes and e-cigarettes. If you need help quitting, ask your health care provider.  Keep all follow-up visits as told by your health care provider. This is important. Contact a health care provider if:  You are struggling to maintain a healthy diet or weight.  You need help to start on an exercise program.  You need help to stop smoking. Get help right away if:  You have chest pain.  You have trouble breathing. This information is not intended to replace advice given to you by your health care provider. Make sure you discuss any questions you have with your health care provider. Document Revised: 04/22/2017 Document Reviewed: 10/18/2015 Elsevier Patient Education  [place].

## 2019-10-05 ENCOUNTER — Encounter: Payer: Self-pay | Admitting: Nurse Practitioner

## 2019-10-05 LAB — B12 AND FOLATE PANEL
Folate: 12.4 ng/mL (ref >3.0)
Vitamin B-12: 549 pg/mL (ref 232–1245)

## 2019-10-05 LAB — SPECIMEN STATUS REPORT

## 2020-04-04 DIAGNOSIS — H16223 Keratoconjunctivitis sicca, not specified as Sjogren's, bilateral: Secondary | ICD-10-CM | POA: Diagnosis not present

## 2020-05-09 DIAGNOSIS — Z23 Encounter for immunization: Secondary | ICD-10-CM | POA: Diagnosis not present

## 2020-07-15 ENCOUNTER — Other Ambulatory Visit: Payer: Self-pay

## 2020-07-15 ENCOUNTER — Telehealth: Payer: BC Managed Care – PPO | Admitting: Medical

## 2020-07-15 ENCOUNTER — Ambulatory Visit: Payer: BC Managed Care – PPO | Admitting: Medical

## 2020-07-15 ENCOUNTER — Encounter: Payer: Self-pay | Admitting: Medical

## 2020-07-15 VITALS — BP 140/90 | HR 76 | Temp 97.6°F | Resp 16 | Wt 188.0 lb

## 2020-07-15 DIAGNOSIS — N390 Urinary tract infection, site not specified: Secondary | ICD-10-CM

## 2020-07-15 DIAGNOSIS — R42 Dizziness and giddiness: Secondary | ICD-10-CM

## 2020-07-15 DIAGNOSIS — Z8679 Personal history of other diseases of the circulatory system: Secondary | ICD-10-CM

## 2020-07-15 LAB — POCT URINALYSIS DIPSTICK
Bilirubin, UA: NEGATIVE
Glucose, UA: NEGATIVE
Ketones, UA: NEGATIVE
Nitrite, UA: NEGATIVE
Protein, UA: NEGATIVE
Spec Grav, UA: 1.02 (ref 1.010–1.025)
Urobilinogen, UA: 0.2 E.U./dL
pH, UA: 6.5 (ref 5.0–8.0)

## 2020-07-15 MED ORDER — NITROFURANTOIN MONOHYD MACRO 100 MG PO CAPS: 100.0000 mg | ORAL_CAPSULE | Freq: Two times a day (BID) | ORAL | 0 refills | Status: DC

## 2020-07-15 NOTE — Progress Notes (Signed)
Subjective:    Patient ID: Latasha Lopez, female    DOB: [birth year], 60 y.o.   MRN: [id number]  HPI 60 yo female in non acute distress stated with Woke up at  4:15am felt dizzy while laying in bed, sit up felt nauseous. Went down stairs about 5am bent over and stood up while trying to  to check blood pressure ( around 8am 138/83) and got nauseous and went to the bathroom and vomiting one time. Laid back down and had dizziness, stood up felt nauseous again and sweaty (clammy), loss balance "alittle bit" she went to the bathroom and dry heaved, no actual vomitng.  Room spins when dizzy, no change in vision or hearing. No headache. No loss of vision no weakness in arms or legs.    Fells better now , no dizziness now.  Initially wanted a virtual appointment because she felt she could not drive. Husband had the day off today and was able to bring her into the clinic today. I called patient and asked her to come into the clinic.  Had a viral sinusitis infection about a month ago. She self Treated with OTC medications . Last meal dinner at  8-9pm chik-fil-at 10pm night  Some candy.   Blood pressure 140/90, pulse 76, temperature 97.6 F (36.4 C), temperature source Oral, resp. rate 16, weight 188 lb (85.3 kg), SpO2 97 %.   Review of Systems  Constitutional: Negative for chills, fatigue and fever.  HENT: Positive for congestion (allergies seasonal). Negative for ear pain and sore throat.   Eyes: Negative for photophobia, pain and visual disturbance.  Respiratory: Negative for cough and shortness of breath.   Cardiovascular: Negative for chest pain, palpitations and leg swelling.  Gastrointestinal: Positive for nausea and vomiting. Negative for abdominal pain.  Genitourinary: Negative for difficulty urinating (no odor).  Musculoskeletal: Negative for myalgias.  Skin: Negative for color change.  Allergic/Immunologic: Positive for environmental allergies (takes allegra daily flonase prn).  Negative for food allergies.  Neurological: Positive for dizziness. Negative for tremors, seizures, syncope, facial asymmetry, speech difficulty, weakness, light-headedness, numbness and headaches.  Psychiatric/Behavioral: Negative for self-injury, sleep disturbance and suicidal ideas.       Objective:   Physical Exam Vitals and nursing note reviewed.  Constitutional:      Appearance: Normal appearance. She is overweight.  HENT:     Head: Normocephalic and atraumatic.     Jaw: There is normal jaw occlusion.     Right Ear: Hearing, ear canal and external ear normal. A middle ear effusion (wax covers most of TM but what I can see looks like ETD) is present.     Left Ear: Hearing, ear canal and external ear normal. A middle ear effusion is present.     Mouth/Throat:     Lips: Pink.     Mouth: Mucous membranes are moist.     Tongue: Tongue does not deviate from midline.     Palate: No lesions.     Pharynx: Oropharynx is clear.     Tonsils: No tonsillar exudate.  Eyes:     Extraocular Movements: Extraocular movements intact.     Conjunctiva/sclera: Conjunctivae normal.     Pupils: Pupils are equal, round, and reactive to light.     Comments: No nystagmus  Cardiovascular:     Rate and Rhythm: Normal rate and regular rhythm.     Heart sounds: Normal heart sounds.  Pulmonary:     Effort: Pulmonary effort is normal.  Breath sounds: Normal breath sounds.  Musculoskeletal:        General: Normal range of motion.     Cervical back: Normal range of motion and neck supple.     Comments: 5/5 grip and upper and lower extremity strength bilaterally No facial asymmetry noted.  Skin:    General: Skin is warm and dry.     Capillary Refill: Capillary refill takes less than 2 seconds.  Neurological:     General: No focal deficit present.     Mental Status: She is alert and oriented to person, place, and time. Mental status is at baseline.     GCS: GCS eye subscore is 4. GCS verbal subscore  is 5. GCS motor subscore is 6.     Cranial Nerves: Cranial nerves are intact.     Sensory: Sensation is intact.     Motor: Motor function is intact.     Coordination: Coordination is intact.     Gait: Gait is intact.     Comments: Easily gets on/off the exam table. Gait wnl  Psychiatric:        Attention and Perception: Attention and perception normal.        Mood and Affect: Mood normal.        Speech: Speech normal.        Behavior: Behavior normal. Behavior is cooperative.        Thought Content: Thought content normal.        Cognition and Memory: Cognition and memory normal.        Judgment: Judgment normal.     Results for orders placed or performed in visit on 07/15/20 (from the past 24 hour(s))  POCT urinalysis dipstick     Status: Abnormal   Collection Time: 07/15/20  2:00 PM  Result Value Ref Range   Color, UA yellow    Clarity, UA clear    Glucose, UA Negative Negative   Bilirubin, UA Negative    Ketones, UA Negative    Spec Grav, UA 1.020 1.010 - 1.025   Blood, UA trace    pH, UA 6.5 5.0 - 8.0   Protein, UA Negative Negative   Urobilinogen, UA 0.2 0.2 or 1.0 E.U./dL   Nitrite, UA Negative    Leukocytes, UA Trace (A) Negative   Appearance     Odor        urine, clear , light yellow Color change on leukocytes and  A trace of blood noted. Assessment & Plan:  Dizziness Eustachian tube dysfunction restart Flonase. UTI Meds ordered this encounter  Medications  . nitrofurantoin, macrocrystal-monohydrate, (MACROBID) 100 MG capsule    Sig: Take 1 capsule (100 mg total) by mouth 2 (two) times daily. Take with food    Dispense:  14 capsule    Refill:  0    I will speak to the  with patient in the morning 8am.. If she feels worse, constant dizziness, vomiting to go to the nearest Emergency Department. Orders Placed This Encounter  Procedures  . Urine Culture  . CMP12+LP+TP+TSH+6AC+CBC/D/Plt  . VITAMIN D 25 Hydroxy (Vit-D Deficiency, Fractures)  . Hgb A1c  w/o eAG  . B12 and Folate Panel  . POCT urinalysis dipstick  Patient verbalizes understanding and has no further questions at discharge. She does have some hydroxyzine and may take one tablet if needed for nausea. Patient made aware of new provider Apolonio Schneiders FNP as patients primary MD.

## 2020-07-15 NOTE — Progress Notes (Addendum)
   Subjective:    Patient ID: Latasha Lopez, female    DOB: August 24, 1960, 60 y.o.   MRN: [id number]  HPI   Patient cancelled appointment.  Review of Systems     Objective:   Physical Exam  xxxxxxxx  Lab results all wnl.     Assessment & Plan:   xxxxx   No orders of the defined types were placed in this encounter.

## 2020-07-15 NOTE — Patient Instructions (Signed)
Eustachian Tube Dysfunction  Eustachian tube dysfunction refers to a condition in which a blockage develops in the narrow passage that connects the middle ear to the back of the nose (eustachian tube). The eustachian tube regulates air pressure in the middle ear by letting air move between the ear and nose. It also helps to drain fluid from the middle ear space. Eustachian tube dysfunction can affect one or both ears. When the eustachian tube does not function properly, air pressure, fluid, or both can build up in the middle ear. What are the causes? This condition occurs when the eustachian tube becomes blocked or cannot open normally. Common causes of this condition include:  Ear infections.  Colds and other infections that affect the nose, mouth, and throat (upper respiratory tract).  Allergies.  Irritation from cigarette smoke.  Irritation from stomach acid coming up into the esophagus (gastroesophageal reflux). The esophagus is the tube that carries food from the mouth to the stomach.  Sudden changes in air pressure, such as from descending in an airplane or scuba diving.  Abnormal growths in the nose or throat, such as: ? Growths that line the nose (nasal polyps). ? Abnormal growth of cells (tumors). ? Enlarged tissue at the back of the throat (adenoids). What increases the risk? You are more likely to develop this condition if:  You smoke.  You are overweight.  You are a child who has: ? Certain birth defects of the mouth, such as cleft palate. ? Large tonsils or adenoids. What are the signs or symptoms? Common symptoms of this condition include:  A feeling of fullness in the ear.  Ear pain.  Clicking or popping noises in the ear.  Ringing in the ear.  Hearing loss.  Loss of balance.  Dizziness. Symptoms may get worse when the air pressure around you changes, such as when you travel to an area of high elevation, fly on an airplane, or go scuba diving. How is  this diagnosed? This condition may be diagnosed based on:  Your symptoms.  A physical exam of your ears, nose, and throat.  Tests, such as those that measure: ? The movement of your eardrum (tympanogram). ? Your hearing (audiometry). How is this treated? Treatment depends on the cause and severity of your condition.  In mild cases, you may relieve your symptoms by moving air into your ears. This is called "popping the ears."  In more severe cases, or if you have symptoms of fluid in your ears, treatment may include: ? Medicines to relieve congestion (decongestants). ? Medicines that treat allergies (antihistamines). ? Nasal sprays or ear drops that contain medicines that reduce swelling (steroids). ? A procedure to drain the fluid in your eardrum (myringotomy). In this procedure, a small tube is placed in the eardrum to:  Drain the fluid.  Restore the air in the middle ear space. ? A procedure to insert a balloon device through the nose to inflate the opening of the eustachian tube (balloon dilation). Follow these instructions at home: Lifestyle  Do not do any of the following until your health care provider approves: ? Travel to high altitudes. ? Fly in airplanes. ? Work in a pressurized cabin or room. ? Scuba dive.  Do not use any products that contain nicotine or tobacco, such as cigarettes and e-cigarettes. If you need help quitting, ask your health care provider.  Keep your ears dry. Wear fitted earplugs during showering and bathing. Dry your ears completely after. General instructions  Take over-the-counter   and prescription medicines only as told by your health care provider.  Use techniques to help pop your ears as recommended by your health care provider. These may include: ? Chewing gum. ? Yawning. ? Frequent, forceful swallowing. ? Closing your mouth, holding your nose closed, and gently blowing as if you are trying to blow air out of your nose.  Keep all  follow-up visits as told by your health care provider. This is important. Contact a health care provider if:  Your symptoms do not go away after treatment.  Your symptoms come back after treatment.  You are unable to pop your ears.  You have: ? A fever. ? Pain in your ear. ? Pain in your head or neck. ? Fluid draining from your ear.  Your hearing suddenly changes.  You become very dizzy.  You lose your balance. Summary  Eustachian tube dysfunction refers to a condition in which a blockage develops in the eustachian tube.  It can be caused by ear infections, allergies, inhaled irritants, or abnormal growths in the nose or throat.  Symptoms include ear pain, hearing loss, or ringing in the ears.  Mild cases are treated with maneuvers to unblock the ears, such as yawning or ear popping.  Severe cases are treated with medicines. Surgery may also be done (rare). This information is not intended to replace advice given to you by your health care provider. Make sure you discuss any questions you have with your health care provider. Document Revised: 08/09/2017 Document Reviewed: 08/09/2017 Elsevier Patient Education  2021 [employer]. Dizziness Dizziness is a common problem. It makes you feel unsteady or light-headed. You may feel like you are about to pass out (faint). Dizziness can lead to getting hurt if you stumble or fall. Dizziness can be caused by many things, including:  Medicines.  Not having enough water in your body (dehydration).  Illness. Follow these instructions at home: Eating and drinking  Drink enough fluid to keep your pee (urine) clear or pale yellow. This helps to keep you from getting dehydrated. Try to drink more clear fluids, such as water.  Do not drink alcohol.  Limit how much caffeine you drink or eat, if your doctor tells you to do that.  Limit how much salt (sodium) you drink or eat, if your doctor tells you to do that.   Activity  Avoid  making quick movements. ? When you stand up from sitting in a chair, steady yourself until you feel okay. ? In the morning, first sit up on the side of the bed. When you feel okay, stand slowly while you hold onto something. Do this until you know that your balance is fine.  If you need to stand in one place for a long time, move your legs often. Tighten and relax the muscles in your legs while you are standing.  Do not drive or use heavy machinery if you feel dizzy.  Avoid bending down if you feel dizzy. Place items in your home so you can reach them easily without leaning over.   Lifestyle  Do not use any products that contain nicotine or tobacco, such as cigarettes and e-cigarettes. If you need help quitting, ask your doctor.  Try to lower your stress level. You can do this by using methods such as yoga or meditation. Talk with your doctor if you need help. General instructions  Watch your dizziness for any changes.  Take over-the-counter and prescription medicines only as told by your doctor. Talk with  your doctor if you think that you are dizzy because of a medicine that you are taking.  Tell a friend or a family member that you are feeling dizzy. If he or she notices any changes in your behavior, have this person call your doctor.  Keep all follow-up visits as told by your doctor. This is important. Contact a doctor if:  Your dizziness does not go away.  Your dizziness or light-headedness gets worse.  You feel sick to your stomach (nauseous).  You have trouble hearing.  You have new symptoms.  You are unsteady on your feet.  You feel like the room is spinning. Get help right away if:  You throw up (vomit) or have watery poop (diarrhea), and you cannot eat or drink anything.  You have trouble: ? Talking. ? Walking. ? Swallowing. ? Using your arms, hands, or legs.  You feel generally weak.  You are not thinking clearly, or you have trouble forming sentences. A  friend or family member may notice this.  You have: ? Chest pain. ? Pain in your belly (abdomen). ? Shortness of breath. ? Sweating.  Your vision changes.  You are bleeding.  You have a very bad headache.  You have neck pain or a stiff neck.  You have a fever. These symptoms may be an emergency. Do not wait to see if the symptoms will go away. Get medical help right away. Call your local emergency services (911 in the [place] Do not drive yourself to the hospital. Summary  Dizziness makes you feel unsteady or light-headed. You may feel like you are about to pass out (faint).  Drink enough fluid to keep your pee (urine) clear or pale yellow. Do not drink alcohol.  Avoid making quick movements if you feel dizzy.  Watch your dizziness for any changes. This information is not intended to replace advice given to you by your health care provider. Make sure you discuss any questions you have with your health care provider. Document Revised: 01/09/2020 Document Reviewed: 05/06/2016 Elsevier Patient Education  [place].

## 2020-07-16 ENCOUNTER — Other Ambulatory Visit: Payer: Self-pay

## 2020-07-16 ENCOUNTER — Encounter: Payer: Self-pay | Admitting: Medical

## 2020-07-16 ENCOUNTER — Telehealth: Payer: BC Managed Care – PPO | Admitting: Medical

## 2020-07-16 DIAGNOSIS — E7800 Pure hypercholesterolemia, unspecified: Secondary | ICD-10-CM

## 2020-07-16 DIAGNOSIS — Z87898 Personal history of other specified conditions: Secondary | ICD-10-CM

## 2020-07-16 DIAGNOSIS — R7309 Other abnormal glucose: Secondary | ICD-10-CM

## 2020-07-16 LAB — CMP12+LP+TP+TSH+6AC+CBC/D/PLT
ALT: 32 IU/L (ref 0–32)
AST: 28 IU/L (ref 0–40)
Albumin/Globulin Ratio: 2.2 (ref 1.2–2.2)
Albumin: 4.7 g/dL (ref 3.8–4.9)
Alkaline Phosphatase: 92 IU/L (ref 44–121)
BUN/Creatinine Ratio: 17 (ref 12–28)
BUN: 12 mg/dL (ref 8–27)
Basophils Absolute: 0.1 10*3/uL (ref 0.0–0.2)
Basos: 1 %
Bilirubin Total: 0.4 mg/dL (ref 0.0–1.2)
Calcium: 9.1 mg/dL (ref 8.7–10.3)
Chloride: 101 mmol/L (ref 96–106)
Chol/HDL Ratio: 3.7 ratio (ref 0.0–4.4)
Cholesterol, Total: 174 mg/dL (ref 100–199)
Creatinine, Ser: 0.69 mg/dL (ref 0.57–1.00)
EOS (ABSOLUTE): 0.1 10*3/uL (ref 0.0–0.4)
Eos: 2 %
Estimated CHD Risk: 0.7 times avg. (ref 0.0–1.0)
Free Thyroxine Index: 2.3 (ref 1.2–4.9)
GGT: 16 IU/L (ref 0–60)
Globulin, Total: 2.1 g/dL (ref 1.5–4.5)
Glucose: 118 mg/dL — ABNORMAL HIGH (ref 65–99)
HDL: 47 mg/dL (ref >39)
Hematocrit: 42 % (ref 34.0–46.6)
Hemoglobin: 14.2 g/dL (ref 11.1–15.9)
Immature Grans (Abs): 0 10*3/uL (ref 0.0–0.1)
Immature Granulocytes: 0 %
Iron: 51 ug/dL (ref 27–159)
LDH: 203 IU/L (ref 119–226)
LDL Chol Calc (NIH): 110 mg/dL — ABNORMAL HIGH (ref 0–99)
Lymphocytes Absolute: 1.4 10*3/uL (ref 0.7–3.1)
Lymphs: 23 %
MCH: 27.1 pg (ref 26.6–33.0)
MCHC: 33.8 g/dL (ref 31.5–35.7)
MCV: 80 fL (ref 79–97)
Monocytes Absolute: 0.4 10*3/uL (ref 0.1–0.9)
Monocytes: 7 %
Neutrophils Absolute: 4.2 10*3/uL (ref 1.4–7.0)
Neutrophils: 67 %
Phosphorus: 3.4 mg/dL (ref 3.0–4.3)
Platelets: 310 10*3/uL (ref 150–450)
Potassium: 3.7 mmol/L (ref 3.5–5.2)
RBC: 5.24 x10E6/uL (ref 3.77–5.28)
RDW: 13.5 % (ref 11.7–15.4)
Sodium: 142 mmol/L (ref 134–144)
T3 Uptake Ratio: 26 % (ref 24–39)
T4, Total: 8.8 ug/dL (ref 4.5–12.0)
TSH: 1.68 u[IU]/mL (ref 0.450–4.500)
Total Protein: 6.8 g/dL (ref 6.0–8.5)
Triglycerides: 95 mg/dL (ref 0–149)
Uric Acid: 6.4 mg/dL (ref 3.0–7.2)
VLDL Cholesterol Cal: 17 mg/dL (ref 5–40)
WBC: 6.2 10*3/uL (ref 3.4–10.8)
eGFR: 99 mL/min/{1.73_m2} (ref >59)

## 2020-07-16 LAB — B12 AND FOLATE PANEL
Folate: 19.8 ng/mL (ref >3.0)
Vitamin B-12: 652 pg/mL (ref 232–1245)

## 2020-07-16 LAB — VITAMIN D 25 HYDROXY (VIT D DEFICIENCY, FRACTURES): Vit D, 25-Hydroxy: 41.4 ng/mL (ref 30.0–100.0)

## 2020-07-16 LAB — HGB A1C W/O EAG: Hgb A1c MFr Bld: 6.3 % — ABNORMAL HIGH (ref 4.8–5.6)

## 2020-07-16 NOTE — Patient Instructions (Signed)
Diabetes Mellitus and Nutrition, Adult When you have diabetes, or diabetes mellitus, it is very important to have healthy eating habits because your blood sugar (glucose) levels are greatly affected by what you eat and drink. Eating healthy foods in the right amounts, at about the same times every day, can help you:  Control your blood glucose.  Lower your risk of heart disease.  Improve your blood pressure.  Reach or maintain a healthy weight. What can affect my meal plan? Every person with diabetes is different, and each person has different needs for a meal plan. Your health care provider may recommend that you work with a dietitian to make a meal plan that is best for you. Your meal plan may vary depending on factors such as:  The calories you need.  The medicines you take.  Your weight.  Your blood glucose, blood pressure, and cholesterol levels.  Your activity level.  Other health conditions you have, such as heart or kidney disease. How do carbohydrates affect me? Carbohydrates, also called carbs, affect your blood glucose level more than any other type of food. Eating carbs naturally raises the amount of glucose in your blood. Carb counting is a method for keeping track of how many carbs you eat. Counting carbs is important to keep your blood glucose at a healthy level, especially if you use insulin or take certain oral diabetes medicines. It is important to know how many carbs you can safely have in each meal. This is different for every person. Your dietitian can help you calculate how many carbs you should have at each meal and for each snack. How does alcohol affect me? Alcohol can cause a sudden decrease in blood glucose (hypoglycemia), especially if you use insulin or take certain oral diabetes medicines. Hypoglycemia can be a life-threatening condition. Symptoms of hypoglycemia, such as sleepiness, dizziness, and confusion, are similar to symptoms of having too much  alcohol.  Do not drink alcohol if: ? Your health care provider tells you not to drink. ? You are pregnant, may be pregnant, or are planning to become pregnant.  If you drink alcohol: ? Do not drink on an empty stomach. ? Limit how much you use to:  0-1 drink a day for women.  0-2 drinks a day for men. ? Be aware of how much alcohol is in your drink. In the [place] one drink equals one 12 oz bottle of beer (355 mL), one 5 oz glass of wine (148 mL), or one 1 oz glass of hard liquor (44 mL). ? Keep yourself hydrated with water, diet soda, or unsweetened iced tea.  Keep in mind that regular soda, juice, and other mixers may contain a lot of sugar and must be counted as carbs. What are tips for following this plan? Reading food labels  Start by checking the serving size on the "Nutrition Facts" label of packaged foods and drinks. The amount of calories, carbs, fats, and other nutrients listed on the label is based on one serving of the item. Many items contain more than one serving per package.  Check the total grams (g) of carbs in one serving. You can calculate the number of servings of carbs in one serving by dividing the total carbs by 15. For example, if a food has 30 g of total carbs per serving, it would be equal to 2 servings of carbs.  Check the number of grams (g) of saturated fats and trans fats in one serving. Choose foods that have   a low amount or none of these fats.  Check the number of milligrams (mg) of salt (sodium) in one serving. Most people should limit total sodium intake to less than 2,300 mg per day.  Always check the nutrition information of foods labeled as "low-fat" or "nonfat." These foods may be higher in added sugar or refined carbs and should be avoided.  Talk to your dietitian to identify your daily goals for nutrients listed on the label. Shopping  Avoid buying canned, pre-made, or processed foods. These foods tend to be high in fat, sodium, and added  sugar.  Shop around the outside edge of the grocery store. This is where you will most often find fresh fruits and vegetables, bulk grains, fresh meats, and fresh dairy. Cooking  Use low-heat cooking methods, such as baking, instead of high-heat cooking methods like deep frying.  Cook using healthy oils, such as olive, canola, or sunflower oil.  Avoid cooking with butter, cream, or high-fat meats. Meal planning  Eat meals and snacks regularly, preferably at the same times every day. Avoid going long periods of time without eating.  Eat foods that are high in fiber, such as fresh fruits, vegetables, beans, and whole grains. Talk with your dietitian about how many servings of carbs you can eat at each meal.  Eat 4-6 oz (112-168 g) of lean protein each day, such as lean meat, chicken, fish, eggs, or tofu. One ounce (oz) of lean protein is equal to: ? 1 oz (28 g) of meat, chicken, or fish. ? 1 egg. ?  cup (62 g) of tofu.  Eat some foods each day that contain healthy fats, such as avocado, nuts, seeds, and fish.   What foods should I eat? Fruits Berries. Apples. Oranges. Peaches. Apricots. Plums. Grapes. Mango. Papaya. Pomegranate. Kiwi. Cherries. Vegetables Lettuce. Spinach. Leafy greens, including kale, chard, collard greens, and mustard greens. Beets. Cauliflower. Cabbage. Broccoli. Carrots. Green beans. Tomatoes. Peppers. Onions. Cucumbers. Brussels sprouts. Grains Whole grains, such as whole-wheat or whole-grain bread, crackers, tortillas, cereal, and pasta. Unsweetened oatmeal. Quinoa. Brown or wild rice. Meats and other proteins Seafood. Poultry without skin. Lean cuts of poultry and beef. Tofu. Nuts. Seeds. Dairy Low-fat or fat-free dairy products such as milk, yogurt, and cheese. The items listed above may not be a complete list of foods and beverages you can eat. Contact a dietitian for more information. What foods should I avoid? Fruits Fruits canned with  syrup. Vegetables Canned vegetables. Frozen vegetables with butter or cream sauce. Grains Refined white flour and flour products such as bread, pasta, snack foods, and cereals. Avoid all processed foods. Meats and other proteins Fatty cuts of meat. Poultry with skin. Breaded or fried meats. Processed meat. Avoid saturated fats. Dairy Full-fat yogurt, cheese, or milk. Beverages Sweetened drinks, such as soda or iced tea. The items listed above may not be a complete list of foods and beverages you should avoid. Contact a dietitian for more information. Questions to ask a health care provider  Do I need to meet with a diabetes educator?  Do I need to meet with a dietitian?  What number can I call if I have questions?  When are the best times to check my blood glucose? Where to find more information:  American Diabetes Association: diabetes.org  Academy of Nutrition and Dietetics: www.eatright.org  National Institute of Diabetes and Digestive and Kidney Diseases: www.niddk.nih.gov  Association of Diabetes Care and Education Specialists: www.diabeteseducator.org Summary  It is important to have healthy eating   habits because your blood sugar (glucose) levels are greatly affected by what you eat and drink.  A healthy meal plan will help you control your blood glucose and maintain a healthy lifestyle.  Your health care provider may recommend that you work with a dietitian to make a meal plan that is best for you.  Keep in mind that carbohydrates (carbs) and alcohol have immediate effects on your blood glucose levels. It is important to count carbs and to use alcohol carefully. This information is not intended to replace advice given to you by your health care provider. Make sure you discuss any questions you have with your health care provider. Document Revised: 03/27/2019 Document Reviewed: 03/27/2019 Elsevier Patient Education  2021 Elsevier Inc.  

## 2020-07-16 NOTE — Progress Notes (Signed)
Called  Patient to see how she did overnight. She states she had no more dizziness or nausea or vomiting. I reviewed her labs with her. Labs reviewed with patient. A1C 6.43 one year ago was 5.8. Elevated LDL 110 Encouraged exercise and wise choosing of foods, reviewed glycemic index with patient. Recommend she have an appointment with Jesus Genera, [facility] staff nutritionist. She will need to call clinic on Monday to see when next appointments are available. Patient verbalizes understanding and has no questions at the end of our conversation.

## 2020-07-17 LAB — URINE CULTURE

## 2020-07-18 ENCOUNTER — Encounter: Payer: Self-pay | Admitting: Medical

## 2020-07-22 ENCOUNTER — Telehealth: Payer: BC Managed Care – PPO | Admitting: Medical

## 2020-07-22 ENCOUNTER — Other Ambulatory Visit: Payer: Self-pay

## 2020-07-22 DIAGNOSIS — H6983 Other specified disorders of Eustachian tube, bilateral: Secondary | ICD-10-CM

## 2020-07-22 MED ORDER — PREDNISONE 10 MG (21) PO TBPK: ORAL_TABLET | ORAL | 0 refills | Status: DC

## 2020-07-22 NOTE — Progress Notes (Signed)
   Subjective:    Patient ID: Marcelino Duster, female    DOB: 02/24/1961, 60 y.o.   MRN: [id number]  HPI  60 yo female in non acute distress consents to telemedicine appointment. I called patient to see how her dizziness had re. She states she has had no more dizzy episodes , but last Saturday felt "woozy headed."  She feels like her ears are full and "not open"/ Overall feeling better.    No Known Allergies   Review of Systems  HENT:       Ears feel not open, fullness  Neurological: Positive for light-headedness. Negative for dizziness and headaches.       Objective:   Physical Exam Treating patient per her exam on 07/15/2020. Effusion , mid ear bilaterally No physical performed this was a telemedicine appointment.     Assessment & Plan:  Eustachian Tube Dysfunction Sesonal Allergies She is taking Allegra daily and has just started back to using Flonase. Meds ordered this encounter  Medications  . predniSONE (STERAPRED UNI-PAK 21 TAB) 10 MG (21) TBPK tablet    Sig: Take 6 tablets by mouth today then 5 tablets tomorrow, then one less tablet everyday thereafter. Take with food    Dispense:  21 tablet    Refill:  0   If this does not resolve, I will refer patient to ENT. Patient verbalizes understanding and has no questions at discharge.

## 2020-07-22 NOTE — Patient Instructions (Signed)

## 2020-08-19 ENCOUNTER — Ambulatory Visit: Payer: BC Managed Care – PPO

## 2020-08-19 ENCOUNTER — Other Ambulatory Visit: Payer: Self-pay

## 2020-08-19 NOTE — Progress Notes (Signed)
Nutrition: CC:My glucose levels are increased.  My A1C was in the 5 range and it is up to 6 and I need help in trying to get it back down.  HT:5'1'' Wt: 186 (home scales)  UBW: 190's, BMI:35.2  Labs: HgA1C: 6.43, Glucose:118, TG: 95, LDL: 110..  Currently she has started to walk.  Aiming for 25-30 minutes each day.  Is not able to get in 7 days per week.  Working full time and is helping her 61 year old father who lives alone.   Finds herself cooking for him and some for her family, but eating out in the evening most of the time.  Has a high stress schedule for herself and will readily admit that the needs of others come first. Meals: Breakfast: Eggs on 2 slices of thin wheat toast or 2 [employer] Protein waffles, plain.  Will drink water or diet coke for the caffeine.   Snack: +/- grapes or raspberries with a slice of cheese. Sips or water or maybe a diet coke. Lunch: [employer] be a large salad or leftover vegetables and a meat. Snack: None usually. Dinner: [employer] grilled chicken on pita bread with lettuce and tomato and a side of french fries. Diet coke to drink.  Usually drinks 32+ ounces of water and Diet Coke 20-32 ounces each day.  Currently using her treadmill and walking on a slight incline for 25-30 minutes about 4-5 days per week.  Review of glucose and insulin and the impact of continued high carbohydrate intake.  Review of food groups and the their roll in the in the glucose/insulin metabolism.  Encouraged daily walking and activity. Aim to change up when in shape to use your rowing machine.  Read food labels and start to monitor and limit CHO intake.  Limit CHO: BK: 15-30 gm Snack: 15-30 gm Lunch: 30-45 gm Dinner: 30-45 gm (Can limit these further and increase your non-starchy vegetable intake. Walk, walk, walk. Avoid concentrated sweets.  Personal Goal: Lose 5 lbs. In 1 month and then continue to lose.  Plan: More exercise. Increase water intake. Watch what I am eating  and decrease carbohydrate intake.  Handouts: Foods/food Groups Pre-Diabetes Guidelines What's on the Nutrition Facts Label  If Skipping breakfast or using a 12-16 hour fast, plan to break it with a 15 gram snack prior to your late lunch.  F/U as you wish for further education and support.

## 2020-10-02 ENCOUNTER — Other Ambulatory Visit: Payer: BC Managed Care – PPO

## 2020-10-02 ENCOUNTER — Other Ambulatory Visit: Payer: Self-pay

## 2020-10-02 DIAGNOSIS — Z0000 Encounter for general adult medical examination without abnormal findings: Secondary | ICD-10-CM

## 2020-10-02 DIAGNOSIS — I10 Essential (primary) hypertension: Secondary | ICD-10-CM

## 2020-10-02 MED ORDER — HYDROCHLOROTHIAZIDE 25 MG PO TABS: 25.0000 mg | ORAL_TABLET | Freq: Every day | ORAL | 1 refills | Status: DC

## 2020-10-03 LAB — CMP12+LP+TP+TSH+6AC+CBC/D/PLT
ALT: 22 IU/L (ref 0–32)
AST: 20 IU/L (ref 0–40)
Albumin/Globulin Ratio: 2.1 (ref 1.2–2.2)
Albumin: 4.1 g/dL (ref 3.8–4.8)
Alkaline Phosphatase: 97 IU/L (ref 44–121)
BUN/Creatinine Ratio: 18 (ref 12–28)
BUN: 15 mg/dL (ref 8–27)
Basophils Absolute: 0.1 10*3/uL (ref 0.0–0.2)
Basos: 1 %
Bilirubin Total: 0.3 mg/dL (ref 0.0–1.2)
Calcium: 8.9 mg/dL (ref 8.7–10.3)
Chloride: 104 mmol/L (ref 96–106)
Chol/HDL Ratio: 3.6 ratio (ref 0.0–4.4)
Cholesterol, Total: 142 mg/dL (ref 100–199)
Creatinine, Ser: 0.83 mg/dL (ref 0.57–1.00)
EOS (ABSOLUTE): 0 10*3/uL (ref 0.0–0.4)
Eos: 1 %
Estimated CHD Risk: 0.6 times avg. (ref 0.0–1.0)
Free Thyroxine Index: 2.1 (ref 1.2–4.9)
GGT: 14 IU/L (ref 0–60)
Globulin, Total: 2 g/dL (ref 1.5–4.5)
Glucose: 116 mg/dL — ABNORMAL HIGH (ref 65–99)
HDL: 40 mg/dL (ref >39)
Hematocrit: 42.8 % (ref 34.0–46.6)
Hemoglobin: 13.4 g/dL (ref 11.1–15.9)
Immature Grans (Abs): 0 10*3/uL (ref 0.0–0.1)
Immature Granulocytes: 0 %
Iron: 48 ug/dL (ref 27–139)
LDH: 185 IU/L (ref 119–226)
LDL Chol Calc (NIH): 81 mg/dL (ref 0–99)
Lymphocytes Absolute: 1.9 10*3/uL (ref 0.7–3.1)
Lymphs: 40 %
MCH: 25.9 pg — ABNORMAL LOW (ref 26.6–33.0)
MCHC: 31.3 g/dL — ABNORMAL LOW (ref 31.5–35.7)
MCV: 83 fL (ref 79–97)
Monocytes Absolute: 0.4 10*3/uL (ref 0.1–0.9)
Monocytes: 9 %
Neutrophils Absolute: 2.3 10*3/uL (ref 1.4–7.0)
Neutrophils: 49 %
Phosphorus: 3.8 mg/dL (ref 3.0–4.3)
Platelets: 282 10*3/uL (ref 150–450)
Potassium: 4.1 mmol/L (ref 3.5–5.2)
RBC: 5.18 x10E6/uL (ref 3.77–5.28)
RDW: 13.6 % (ref 11.7–15.4)
Sodium: 143 mmol/L (ref 134–144)
T3 Uptake Ratio: 28 % (ref 24–39)
T4, Total: 7.5 ug/dL (ref 4.5–12.0)
TSH: 3.54 u[IU]/mL (ref 0.450–4.500)
Total Protein: 6.1 g/dL (ref 6.0–8.5)
Triglycerides: 118 mg/dL (ref 0–149)
Uric Acid: 6.2 mg/dL (ref 3.0–7.2)
VLDL Cholesterol Cal: 21 mg/dL (ref 5–40)
WBC: 4.7 10*3/uL (ref 3.4–10.8)
eGFR: 80 mL/min/{1.73_m2} (ref >59)

## 2020-10-03 LAB — HGB A1C W/O EAG: Hgb A1c MFr Bld: 6.3 % — ABNORMAL HIGH (ref 4.8–5.6)

## 2020-10-09 ENCOUNTER — Ambulatory Visit: Payer: BC Managed Care – PPO | Admitting: Nurse Practitioner

## 2020-10-09 ENCOUNTER — Other Ambulatory Visit: Payer: Self-pay

## 2020-10-09 ENCOUNTER — Encounter: Payer: Self-pay | Admitting: Nurse Practitioner

## 2020-10-09 VITALS — BP 130/86 | HR 82 | Temp 97.9°F | Resp 16 | Ht 63.5 in | Wt 188.2 lb

## 2020-10-09 DIAGNOSIS — F419 Anxiety disorder, unspecified: Secondary | ICD-10-CM

## 2020-10-09 DIAGNOSIS — I10 Essential (primary) hypertension: Secondary | ICD-10-CM

## 2020-10-09 DIAGNOSIS — Z636 Dependent relative needing care at home: Secondary | ICD-10-CM

## 2020-10-09 DIAGNOSIS — E538 Deficiency of other specified B group vitamins: Secondary | ICD-10-CM

## 2020-10-09 DIAGNOSIS — Z0000 Encounter for general adult medical examination without abnormal findings: Secondary | ICD-10-CM

## 2020-10-09 DIAGNOSIS — E559 Vitamin D deficiency, unspecified: Secondary | ICD-10-CM

## 2020-10-09 DIAGNOSIS — E039 Hypothyroidism, unspecified: Secondary | ICD-10-CM

## 2020-10-09 LAB — POCT URINALYSIS DIPSTICK OB
Bilirubin, UA: NEGATIVE
Blood, UA: NEGATIVE
Glucose, UA: NEGATIVE
Ketones, UA: NEGATIVE
Leukocytes, UA: NEGATIVE
Nitrite, UA: NEGATIVE
POC,PROTEIN,UA: NEGATIVE
Spec Grav, UA: 1.01 (ref 1.010–1.025)
Urobilinogen, UA: 0.2 E.U./dL
pH, UA: 5 (ref 5.0–8.0)

## 2020-10-09 MED ORDER — LEVOTHYROXINE SODIUM 25 MCG PO TABS: 25.0000 ug | ORAL_TABLET | Freq: Every day | ORAL | 1 refills | Status: DC

## 2020-10-09 MED ORDER — HYDROXYZINE HCL 10 MG PO TABS: 10.0000 mg | ORAL_TABLET | Freq: Four times a day (QID) | ORAL | 1 refills | Status: DC | PRN

## 2020-10-09 MED ORDER — HYDROCHLOROTHIAZIDE 25 MG PO TABS: 25.0000 mg | ORAL_TABLET | Freq: Every day | ORAL | 1 refills | Status: DC

## 2020-10-09 NOTE — Progress Notes (Signed)
yellow 

## 2020-10-09 NOTE — Patient Instructions (Signed)
   Call [facility] for Mammogram  [phone]  Call [facility] for skin screening [phone]) [phone]  Schedule Eye doctor to discuss contacts  Consistent use of Flonase (prescription sent to [facility])  Refills on thyroid and blood pressure medication also at [facility]  Consider Shingles vaccine prescription at [facility] if you would like to get that   Switched hydroxyzine to 10mg  as needed  Follow up in 6 months for labs and visit   Concentrate on low carbohydrate diet.

## 2020-10-09 NOTE — Progress Notes (Signed)
Subjective:    Patient ID: Latasha Lopez, female    DOB: 1961-03-26, 60 y.o.   MRN: [id number]  HPI 60 year old female presenting to [facility] for annual physical.   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   She has a history of hypothyroidism. Was started on Synthroid when she was a patient at [facility].   History of HTN currently managed on HCTZ  History of Vitamin D deficiency takes 1,000units daily OTC History of Vitamin B deficiency currently takes 78mg OTC (oral) Seasonal allergies stays on Flonase and Allegra for relief  Uses hydroxyzine as needed for anxiety- mainly related to caregiving. Anxiety onset was with the passing of her Mother in 2020 and now she is main caregiver for her father who Is 964   She has had 3 Moderna vaccines for COVID-19 denies a personal history of COVID-19  Last mammogram was 07/2019 Believes she has had a Dexa scan in the past- no records available from this Colonoscopy 05/2017 repeat in 10 years, performed at [facility]    Denies a history of smoking  Denies a history of asthma   Has not had Shingles vaccine due now Due for pneumonia vaccine at 89 Patient states she has been out of her medications for about 2 weeks. Was not on them when she had bloodwork done one week ago.   She has also noted that she has balance issues when going down stairs mainly. Occasionally will have dizziness when standing up quickly from desk.  Was seen in clinic in March for Eustachian tube dysfunction and vertigo.  Wears bifocal glasses.     Today's Vitals   10/09/20 0929  BP: 130/86  Pulse: 82  Resp: 16  Temp: 97.9 F (36.6 C)  TempSrc: Tympanic  SpO2: 96%  Weight: 188 lb 3.2 oz (85.4 kg)  Height: 5' 3.5" (1.613 m)   Body mass index is 32.82 kg/m.   Past Medical History:  Diagnosis Date   Allergy    History of chicken pox    Hypertension    Thyroid disease      No Known Allergies   Past Surgical  History:  Procedure Laterality Date   BREAST BIOPSY Left [id number]   neg   TOTAL ABDOMINAL HYSTERECTOMY  2005    Review of Systems  Constitutional: Negative.   HENT: Negative.    Eyes: Negative.   Respiratory: Negative.    Cardiovascular: Negative.   Gastrointestinal: Negative.   Endocrine: Negative.   Genitourinary: Negative.   Musculoskeletal: Negative.   Neurological:  Positive for dizziness.  Hematological: Negative.   Psychiatric/Behavioral:  The patient is nervous/anxious.       Objective:   Physical Exam Constitutional:      Appearance: Normal appearance.  HENT:     Head: Normocephalic.     Right Ear: Tympanic membrane, ear canal and external ear normal.     Left Ear: Tympanic membrane, ear canal and external ear normal.     Nose: Nose normal.  Eyes:     Pupils: Pupils are equal, round, and reactive to light.  Cardiovascular:     Rate and Rhythm: Normal rate and regular rhythm.     Heart sounds: Normal heart sounds.  Pulmonary:     Effort: Pulmonary effort is normal.     Breath sounds: Normal breath sounds.  Chest:     Chest wall: No mass or tenderness.  Breasts:    Right: Normal.  Left: Normal.  Musculoskeletal:        General: No swelling.     Cervical back: Normal range of motion.  Skin:    General: Skin is warm.     Capillary Refill: Capillary refill takes less than 2 seconds.          Comments: Mole to left chest with irregular boarder dark brown, scattered moles throughout chest arms and back   Neurological:     Mental Status: She is alert.     GCS: GCS eye subscore is 4. GCS verbal subscore is 5. GCS motor subscore is 6.     Cranial Nerves: Cranial nerves are intact.     Sensory: Sensation is intact.     Motor: Motor function is intact.     Coordination: Romberg sign negative. Coordination abnormal. Finger-Nose-Finger Test and Heel to [facility] Test normal.     Gait: Gait is intact.  Psychiatric:        Mood and Affect: Mood normal.    Recent  Results (from the past 2160 hour(s))  CMP12+LP+TP+TSH+6AC+CBC/D/Plt     Status: Abnormal   Collection Time: 07/15/20  1:55 PM  Result Value Ref Range   Glucose 118 (H) 65 - 99 mg/dL   Uric Acid 6.4 3.0 - 7.2 mg/dL    Comment:            Therapeutic target for gout patients: <6.0   BUN 12 8 - 27 mg/dL   Creatinine, Ser 0.69 0.57 - 1.00 mg/dL   eGFR 99 >59 mL/min/1.73   BUN/Creatinine Ratio 17 12 - 28   Sodium 142 134 - 144 mmol/L   Potassium 3.7 3.5 - 5.2 mmol/L   Chloride 101 96 - 106 mmol/L   Calcium 9.1 8.7 - 10.3 mg/dL   Phosphorus 3.4 3.0 - 4.3 mg/dL   Total Protein 6.8 6.0 - 8.5 g/dL   Albumin 4.7 3.8 - 4.9 g/dL   Globulin, Total 2.1 1.5 - 4.5 g/dL   Albumin/Globulin Ratio 2.2 1.2 - 2.2   Bilirubin Total 0.4 0.0 - 1.2 mg/dL   Alkaline Phosphatase 92 44 - 121 IU/L   LDH 203 119 - 226 IU/L   AST 28 0 - 40 IU/L   ALT 32 0 - 32 IU/L   GGT 16 0 - 60 IU/L   Iron 51 27 - 159 ug/dL   Cholesterol, Total 174 100 - 199 mg/dL   Triglycerides 95 0 - 149 mg/dL   HDL 47 >39 mg/dL   VLDL Cholesterol Cal 17 5 - 40 mg/dL   LDL Chol Calc (NIH) 110 (H) 0 - 99 mg/dL   Chol/HDL Ratio 3.7 0.0 - 4.4 ratio    Comment:                                   T. Chol/HDL Ratio                                             Men  Women                               1/2 Avg.Risk  3.4    3.3  Avg.Risk  5.0    4.4                                2X Avg.Risk  9.6    7.1                                3X Avg.Risk 23.4   11.0    Estimated CHD Risk 0.7 0.0 - 1.0 times avg.    Comment: The CHD Risk is based on the T. Chol/HDL ratio. Other factors affect CHD Risk such as hypertension, smoking, diabetes, severe obesity, and family history of premature CHD.    TSH 1.680 0.450 - 4.500 uIU/mL   T4, Total 8.8 4.5 - 12.0 ug/dL   T3 Uptake Ratio 26 24 - 39 %   Free Thyroxine Index 2.3 1.2 - 4.9   WBC 6.2 3.4 - 10.8 x10E3/uL   RBC 5.24 3.77 - 5.28 x10E6/uL   Hemoglobin 14.2 11.1 -  15.9 g/dL   Hematocrit 42.0 34.0 - 46.6 %   MCV 80 79 - 97 fL   MCH 27.1 26.6 - 33.0 pg   MCHC 33.8 31.5 - 35.7 g/dL   RDW 13.5 11.7 - 15.4 %   Platelets 310 150 - 450 x10E3/uL   Neutrophils 67 Not Estab. %   Lymphs 23 Not Estab. %   Monocytes 7 Not Estab. %   Eos 2 Not Estab. %   Basos 1 Not Estab. %   Neutrophils Absolute 4.2 1.4 - 7.0 x10E3/uL   Lymphocytes Absolute 1.4 0.7 - 3.1 x10E3/uL   Monocytes Absolute 0.4 0.1 - 0.9 x10E3/uL   EOS (ABSOLUTE) 0.1 0.0 - 0.4 x10E3/uL   Basophils Absolute 0.1 0.0 - 0.2 x10E3/uL   Immature Granulocytes 0 Not Estab. %   Immature Grans (Abs) 0.0 0.0 - 0.1 x10E3/uL  VITAMIN D 25 Hydroxy (Vit-D Deficiency, Fractures)     Status: None   Collection Time: 07/15/20  1:55 PM  Result Value Ref Range   Vit D, 25-Hydroxy 41.4 30.0 - 100.0 ng/mL    Comment: Vitamin D deficiency has been defined by the Institute of Medicine and an Endocrine Society practice guideline as a level of serum 25-OH vitamin D less than 20 ng/mL (1,2). The Endocrine Society went on to further define vitamin D insufficiency as a level between 21 and 29 ng/mL (2). 1. IOM (Institute of Medicine). 2010. Dietary reference    intakes for calcium and D. [place]: The    [employer]. 2. Holick MF, Binkley [place], Bischoff-Ferrari HA, et al.    Evaluation, treatment, and prevention of vitamin D    deficiency: an Endocrine Society clinical practice    guideline. JCEM. 2011 Jul; 96(7):1911-30.   Hgb A1c w/o eAG     Status: Abnormal   Collection Time: 07/15/20  1:55 PM  Result Value Ref Range   Hgb A1c MFr Bld 6.3 (H) 4.8 - 5.6 %    Comment:          Prediabetes: 5.7 - 6.4          Diabetes: >6.4          Glycemic control for adults with diabetes: <7.0   Urine Culture     Status: None   Collection Time: 07/15/20  1:55 PM   Specimen: Blood   BLD  Result Value Ref Range  Urine Culture, Routine Final report    Organism ID, Bacteria Comment     Comment: Mixed  urogenital flora 10,000-25,000 colony forming units per mL   B12 and Folate Panel     Status: None   Collection Time: 07/15/20  1:55 PM  Result Value Ref Range   Vitamin B-12 652 232 - 1,245 pg/mL   Folate 19.8 >3.0 ng/mL    Comment: A serum folate concentration of less than 3.1 ng/mL is considered to represent clinical deficiency.   POCT urinalysis dipstick     Status: Abnormal   Collection Time: 07/15/20  2:00 PM  Result Value Ref Range   Color, UA yellow    Clarity, UA clear    Glucose, UA Negative Negative   Bilirubin, UA Negative    Ketones, UA Negative    Spec Grav, UA 1.020 1.010 - 1.025   Blood, UA trace    pH, UA 6.5 5.0 - 8.0   Protein, UA Negative Negative   Urobilinogen, UA 0.2 0.2 or 1.0 E.U./dL   Nitrite, UA Negative    Leukocytes, UA Trace (A) Negative   Appearance     Odor    Hgb A1c w/o eAG     Status: Abnormal   Collection Time: 10/02/20  8:02 AM  Result Value Ref Range   Hgb A1c MFr Bld 6.3 (H) 4.8 - 5.6 %    Comment:          Prediabetes: 5.7 - 6.4          Diabetes: >6.4          Glycemic control for adults with diabetes: <7.0   CMP12+LP+TP+TSH+6AC+CBC/D/Plt     Status: Abnormal   Collection Time: 10/02/20  8:08 AM  Result Value Ref Range   Glucose 116 (H) 65 - 99 mg/dL   Uric Acid 6.2 3.0 - 7.2 mg/dL    Comment:            Therapeutic target for gout patients: <6.0   BUN 15 8 - 27 mg/dL   Creatinine, Ser 0.83 0.57 - 1.00 mg/dL   eGFR 80 >59 mL/min/1.73   BUN/Creatinine Ratio 18 12 - 28   Sodium 143 134 - 144 mmol/L   Potassium 4.1 3.5 - 5.2 mmol/L   Chloride 104 96 - 106 mmol/L   Calcium 8.9 8.7 - 10.3 mg/dL   Phosphorus 3.8 3.0 - 4.3 mg/dL   Total Protein 6.1 6.0 - 8.5 g/dL   Albumin 4.1 3.8 - 4.8 g/dL   Globulin, Total 2.0 1.5 - 4.5 g/dL   Albumin/Globulin Ratio 2.1 1.2 - 2.2   Bilirubin Total 0.3 0.0 - 1.2 mg/dL   Alkaline Phosphatase 97 44 - 121 IU/L   LDH 185 119 - 226 IU/L   AST 20 0 - 40 IU/L   ALT 22 0 - 32 IU/L   GGT 14 0 - 60  IU/L   Iron 48 27 - 139 ug/dL   Cholesterol, Total 142 100 - 199 mg/dL   Triglycerides 118 0 - 149 mg/dL   HDL 40 >39 mg/dL   VLDL Cholesterol Cal 21 5 - 40 mg/dL   LDL Chol Calc (NIH) 81 0 - 99 mg/dL   Chol/HDL Ratio 3.6 0.0 - 4.4 ratio    Comment:                                   T. Chol/HDL Ratio  Men  Women                               1/2 Avg.Risk  3.4    3.3                                   Avg.Risk  5.0    4.4                                2X Avg.Risk  9.6    7.1                                3X Avg.Risk 23.4   11.0    Estimated CHD Risk 0.6 0.0 - 1.0 times avg.    Comment: The CHD Risk is based on the T. Chol/HDL ratio. Other factors affect CHD Risk such as hypertension, smoking, diabetes, severe obesity, and family history of premature CHD.    TSH 3.540 0.450 - 4.500 uIU/mL   T4, Total 7.5 4.5 - 12.0 ug/dL   T3 Uptake Ratio 28 24 - 39 %   Free Thyroxine Index 2.1 1.2 - 4.9   WBC 4.7 3.4 - 10.8 x10E3/uL   RBC 5.18 3.77 - 5.28 x10E6/uL   Hemoglobin 13.4 11.1 - 15.9 g/dL   Hematocrit 42.8 34.0 - 46.6 %   MCV 83 79 - 97 fL   MCH 25.9 (L) 26.6 - 33.0 pg   MCHC 31.3 (L) 31.5 - 35.7 g/dL   RDW 13.6 11.7 - 15.4 %   Platelets 282 150 - 450 x10E3/uL   Neutrophils 49 Not Estab. %   Lymphs 40 Not Estab. %   Monocytes 9 Not Estab. %   Eos 1 Not Estab. %   Basos 1 Not Estab. %   Neutrophils Absolute 2.3 1.4 - 7.0 x10E3/uL   Lymphocytes Absolute 1.9 0.7 - 3.1 x10E3/uL   Monocytes Absolute 0.4 0.1 - 0.9 x10E3/uL   EOS (ABSOLUTE) 0.0 0.0 - 0.4 x10E3/uL   Basophils Absolute 0.1 0.0 - 0.2 x10E3/uL   Immature Granulocytes 0 Not Estab. %   Immature Grans (Abs) 0.0 0.0 - 0.1 x10E3/uL  POC Urinalysis Dipstick OB     Status: Normal   Collection Time: 10/09/20  9:46 AM  Result Value Ref Range   Color, UA yellow    Clarity, UA clear    Glucose, UA Negative Negative   Bilirubin, UA negative    Ketones, UA Negative    Spec Grav, UA  1.010 1.010 - 1.025   Blood, UA Negative    pH, UA 5.0 5.0 - 8.0   POC,PROTEIN,UA Negative Negative, Trace, Small (1+), Moderate (2+), Large (3+), 4+   Urobilinogen, UA 0.2 0.2 or 1.0 E.U./dL   Nitrite, UA Negative    Leukocytes, UA Negative Negative   Appearance clear    Odor       Reviewed labs with patent  Elevated fasting glucose and A1C        Assessment & Plan:   Need for screening: Patient to schedule Mammogram, order placed for [facility]  Abnormal mole: Patient will schedule with [facility] for skin screening.  Need for immunization: Order for Shingles vaccine sent to [facility]  Decrease hydroxyzine to decrease sedative effect  continue to use as needed return to clinic if anxiety is present >50% of days to discuss a daily medication  Refill Synthroid and HCTZ Restart B12 and Vitamin D3 OTC will follow up with vitamin levels when labs return Return to clinic in 6 months for repeat A1C and Thyroid panel Schedule with Eye doctor to discuss vision/dizziness with stairs and bifocals. May try contacts, if no improvement will consider Neuro vs ENT referral  Consistently use Flonase daily to decrease risk of further eustachian tube issues.  Next Colonoscopy 2029 Dexascan at 62 Pneumonia vaccine at 65

## 2020-10-10 ENCOUNTER — Encounter: Payer: Self-pay | Admitting: Nurse Practitioner

## 2020-10-10 ENCOUNTER — Other Ambulatory Visit: Payer: Self-pay | Admitting: Nurse Practitioner

## 2020-10-10 DIAGNOSIS — J301 Allergic rhinitis due to pollen: Secondary | ICD-10-CM

## 2020-10-10 LAB — VITAMIN D 25 HYDROXY (VIT D DEFICIENCY, FRACTURES): Vit D, 25-Hydroxy: 47.6 ng/mL (ref 30.0–100.0)

## 2020-10-10 LAB — VITAMIN B12: Vitamin B-12: 464 pg/mL (ref 232–1245)

## 2020-10-10 LAB — SPECIMEN STATUS REPORT

## 2020-10-10 MED ORDER — FLUTICASONE PROPIONATE 50 MCG/ACT NA SUSP: 2.0000 | Freq: Every day | NASAL | 11 refills | Status: DC

## 2020-10-14 ENCOUNTER — Other Ambulatory Visit: Payer: Self-pay

## 2020-10-14 ENCOUNTER — Ambulatory Visit
Admission: RE | Admit: 2020-10-14 | Discharge: 2020-10-14 | Disposition: A | Discharge status: Home / Self Care | Payer: BC Managed Care – PPO | Source: Ambulatory Visit | Attending: Nurse Practitioner | Admitting: Nurse Practitioner

## 2020-10-14 DIAGNOSIS — R928 Other abnormal and inconclusive findings on diagnostic imaging of breast: Secondary | ICD-10-CM | POA: Diagnosis not present

## 2020-10-14 DIAGNOSIS — Z0000 Encounter for general adult medical examination without abnormal findings: Secondary | ICD-10-CM

## 2020-10-14 DIAGNOSIS — Z1231 Encounter for screening mammogram for malignant neoplasm of breast: Secondary | ICD-10-CM | POA: Diagnosis not present

## 2020-10-16 ENCOUNTER — Other Ambulatory Visit: Payer: Self-pay | Admitting: Nurse Practitioner

## 2020-10-16 DIAGNOSIS — R928 Other abnormal and inconclusive findings on diagnostic imaging of breast: Secondary | ICD-10-CM

## 2020-10-16 DIAGNOSIS — N6489 Other specified disorders of breast: Secondary | ICD-10-CM

## 2020-10-21 ENCOUNTER — Ambulatory Visit
Admission: RE | Admit: 2020-10-21 | Discharge: 2020-10-21 | Disposition: A | Discharge status: Home / Self Care | Payer: BC Managed Care – PPO | Source: Ambulatory Visit | Attending: Nurse Practitioner | Admitting: Nurse Practitioner

## 2020-10-21 ENCOUNTER — Other Ambulatory Visit: Payer: Self-pay

## 2020-10-21 DIAGNOSIS — R928 Other abnormal and inconclusive findings on diagnostic imaging of breast: Secondary | ICD-10-CM | POA: Diagnosis not present

## 2020-10-21 DIAGNOSIS — N6489 Other specified disorders of breast: Secondary | ICD-10-CM | POA: Diagnosis not present

## 2020-10-21 DIAGNOSIS — R922 Inconclusive mammogram: Secondary | ICD-10-CM | POA: Diagnosis not present

## 2020-10-28 ENCOUNTER — Encounter: Payer: Self-pay | Admitting: Nurse Practitioner

## 2020-10-28 ENCOUNTER — Other Ambulatory Visit: Payer: Self-pay | Admitting: Nurse Practitioner

## 2020-10-28 DIAGNOSIS — N6320 Unspecified lump in the left breast, unspecified quadrant: Secondary | ICD-10-CM

## 2020-10-28 NOTE — Progress Notes (Signed)
6 month ultrasound of left breast ordered as recommended by Mammogram results from 10/2020

## 2020-12-03 ENCOUNTER — Other Ambulatory Visit: Payer: Self-pay | Admitting: Nurse Practitioner

## 2020-12-03 DIAGNOSIS — F419 Anxiety disorder, unspecified: Secondary | ICD-10-CM

## 2020-12-03 DIAGNOSIS — Z636 Dependent relative needing care at home: Secondary | ICD-10-CM

## 2020-12-29 ENCOUNTER — Other Ambulatory Visit: Payer: Self-pay | Admitting: Nurse Practitioner

## 2020-12-29 DIAGNOSIS — F419 Anxiety disorder, unspecified: Secondary | ICD-10-CM

## 2020-12-29 DIAGNOSIS — Z636 Dependent relative needing care at home: Secondary | ICD-10-CM

## 2021-01-01 ENCOUNTER — Other Ambulatory Visit: Payer: Self-pay | Admitting: Nurse Practitioner

## 2021-01-01 DIAGNOSIS — F419 Anxiety disorder, unspecified: Secondary | ICD-10-CM

## 2021-01-01 DIAGNOSIS — Z636 Dependent relative needing care at home: Secondary | ICD-10-CM

## 2021-01-01 MED ORDER — HYDROXYZINE HCL 10 MG PO TABS: 10.0000 mg | ORAL_TABLET | Freq: Four times a day (QID) | ORAL | 1 refills | Status: DC | PRN

## 2021-01-01 NOTE — Progress Notes (Signed)
Refilled hydroxyzine.

## 2021-01-31 ENCOUNTER — Other Ambulatory Visit: Payer: Self-pay | Admitting: Nurse Practitioner

## 2021-01-31 DIAGNOSIS — F419 Anxiety disorder, unspecified: Secondary | ICD-10-CM

## 2021-03-17 ENCOUNTER — Other Ambulatory Visit: Payer: Self-pay | Admitting: Nurse Practitioner

## 2021-03-17 DIAGNOSIS — N6320 Unspecified lump in the left breast, unspecified quadrant: Secondary | ICD-10-CM

## 2021-04-01 ENCOUNTER — Other Ambulatory Visit: Payer: Self-pay | Admitting: Nurse Practitioner

## 2021-04-01 DIAGNOSIS — E039 Hypothyroidism, unspecified: Secondary | ICD-10-CM

## 2021-04-14 ENCOUNTER — Other Ambulatory Visit: Payer: Self-pay | Admitting: Medical

## 2021-04-14 ENCOUNTER — Other Ambulatory Visit: Payer: BC Managed Care – PPO

## 2021-04-14 ENCOUNTER — Other Ambulatory Visit: Payer: Self-pay

## 2021-04-14 DIAGNOSIS — E559 Vitamin D deficiency, unspecified: Secondary | ICD-10-CM

## 2021-04-14 DIAGNOSIS — Z0000 Encounter for general adult medical examination without abnormal findings: Secondary | ICD-10-CM

## 2021-04-14 DIAGNOSIS — E538 Deficiency of other specified B group vitamins: Secondary | ICD-10-CM

## 2021-04-14 DIAGNOSIS — N6320 Unspecified lump in the left breast, unspecified quadrant: Secondary | ICD-10-CM

## 2021-04-14 DIAGNOSIS — E039 Hypothyroidism, unspecified: Secondary | ICD-10-CM

## 2021-04-14 DIAGNOSIS — Z8679 Personal history of other diseases of the circulatory system: Secondary | ICD-10-CM

## 2021-04-14 DIAGNOSIS — E781 Pure hyperglyceridemia: Secondary | ICD-10-CM

## 2021-04-14 DIAGNOSIS — Z87898 Personal history of other specified conditions: Secondary | ICD-10-CM

## 2021-04-14 NOTE — Progress Notes (Signed)
Labs placed per Apolonio Schneiders NP request.

## 2021-04-16 ENCOUNTER — Telehealth: Payer: Self-pay | Admitting: Nurse Practitioner

## 2021-04-16 ENCOUNTER — Encounter: Payer: Self-pay | Admitting: Nurse Practitioner

## 2021-04-16 LAB — CMP12+LP+TP+TSH+6AC+CBC/D/PLT
ALT: 20 IU/L (ref 0–32)
AST: 21 IU/L (ref 0–40)
Albumin/Globulin Ratio: 2.1 (ref 1.2–2.2)
Albumin: 4.4 g/dL (ref 3.8–4.8)
Alkaline Phosphatase: 108 IU/L (ref 44–121)
BUN/Creatinine Ratio: 17 (ref 12–28)
BUN: 15 mg/dL (ref 8–27)
Basophils Absolute: 0.1 10*3/uL (ref 0.0–0.2)
Basos: 1 %
Bilirubin Total: 0.2 mg/dL (ref 0.0–1.2)
Calcium: 9.2 mg/dL (ref 8.7–10.3)
Chloride: 99 mmol/L (ref 96–106)
Chol/HDL Ratio: 3.6 ratio (ref 0.0–4.4)
Cholesterol, Total: 160 mg/dL (ref 100–199)
Creatinine, Ser: 0.88 mg/dL (ref 0.57–1.00)
EOS (ABSOLUTE): 0.1 10*3/uL (ref 0.0–0.4)
Eos: 2 %
Estimated CHD Risk: 0.6 times avg. (ref 0.0–1.0)
Free Thyroxine Index: 2 (ref 1.2–4.9)
GGT: 14 IU/L (ref 0–60)
Globulin, Total: 2.1 g/dL (ref 1.5–4.5)
Glucose: 121 mg/dL — ABNORMAL HIGH (ref 70–99)
HDL: 45 mg/dL (ref >39)
Hematocrit: 41.9 % (ref 34.0–46.6)
Hemoglobin: 13.9 g/dL (ref 11.1–15.9)
Immature Grans (Abs): 0 10*3/uL (ref 0.0–0.1)
Immature Granulocytes: 0 %
Iron: 48 ug/dL (ref 27–139)
LDH: 184 IU/L (ref 119–226)
LDL Chol Calc (NIH): 94 mg/dL (ref 0–99)
Lymphocytes Absolute: 2.1 10*3/uL (ref 0.7–3.1)
Lymphs: 36 %
MCH: 27.4 pg (ref 26.6–33.0)
MCHC: 33.2 g/dL (ref 31.5–35.7)
MCV: 83 fL (ref 79–97)
Monocytes Absolute: 0.6 10*3/uL (ref 0.1–0.9)
Monocytes: 10 %
Neutrophils Absolute: 2.9 10*3/uL (ref 1.4–7.0)
Neutrophils: 51 %
Phosphorus: 4.3 mg/dL (ref 3.0–4.3)
Platelets: 283 10*3/uL (ref 150–450)
Potassium: 4.1 mmol/L (ref 3.5–5.2)
RBC: 5.08 x10E6/uL (ref 3.77–5.28)
RDW: 12.5 % (ref 11.7–15.4)
Sodium: 140 mmol/L (ref 134–144)
T3 Uptake Ratio: 25 % (ref 24–39)
T4, Total: 8 ug/dL (ref 4.5–12.0)
TSH: 4.11 u[IU]/mL (ref 0.450–4.500)
Total Protein: 6.5 g/dL (ref 6.0–8.5)
Triglycerides: 118 mg/dL (ref 0–149)
Uric Acid: 5.8 mg/dL (ref 3.0–7.2)
VLDL Cholesterol Cal: 21 mg/dL (ref 5–40)
WBC: 5.7 10*3/uL (ref 3.4–10.8)
eGFR: 75 mL/min/{1.73_m2} (ref >59)

## 2021-04-16 LAB — HGB A1C W/O EAG: Hgb A1c MFr Bld: 6 % — ABNORMAL HIGH (ref 4.8–5.6)

## 2021-04-16 LAB — B12 AND FOLATE PANEL
Folate: 11 ng/mL (ref >3.0)
Vitamin B-12: 638 pg/mL (ref 232–1245)

## 2021-04-16 LAB — VITAMIN D 25 HYDROXY (VIT D DEFICIENCY, FRACTURES): Vit D, 25-Hydroxy: 40.2 ng/mL (ref 30.0–100.0)

## 2021-04-16 NOTE — Telephone Encounter (Signed)
Patient need follow up on labs and ultrasound of breast

## 2021-04-21 ENCOUNTER — Ambulatory Visit
Admission: RE | Admit: 2021-04-21 | Discharge: 2021-04-21 | Disposition: A | Discharge status: Home / Self Care | Payer: BC Managed Care – PPO | Source: Ambulatory Visit | Attending: Nurse Practitioner | Admitting: Nurse Practitioner

## 2021-04-21 ENCOUNTER — Other Ambulatory Visit: Payer: Self-pay

## 2021-04-21 ENCOUNTER — Other Ambulatory Visit: Payer: BC Managed Care – PPO

## 2021-04-21 DIAGNOSIS — N6322 Unspecified lump in the left breast, upper inner quadrant: Secondary | ICD-10-CM | POA: Diagnosis not present

## 2021-04-21 DIAGNOSIS — N6320 Unspecified lump in the left breast, unspecified quadrant: Secondary | ICD-10-CM | POA: Diagnosis not present

## 2021-04-21 DIAGNOSIS — N6321 Unspecified lump in the left breast, upper outer quadrant: Secondary | ICD-10-CM | POA: Diagnosis not present

## 2021-04-22 ENCOUNTER — Telehealth: Payer: Self-pay | Admitting: Nurse Practitioner

## 2021-04-22 DIAGNOSIS — D2261 Melanocytic nevi of right upper limb, including shoulder: Secondary | ICD-10-CM | POA: Diagnosis not present

## 2021-04-22 DIAGNOSIS — D2272 Melanocytic nevi of left lower limb, including hip: Secondary | ICD-10-CM | POA: Diagnosis not present

## 2021-04-22 DIAGNOSIS — D225 Melanocytic nevi of trunk: Secondary | ICD-10-CM | POA: Diagnosis not present

## 2021-04-22 DIAGNOSIS — N6320 Unspecified lump in the left breast, unspecified quadrant: Secondary | ICD-10-CM

## 2021-04-22 DIAGNOSIS — D2262 Melanocytic nevi of left upper limb, including shoulder: Secondary | ICD-10-CM | POA: Diagnosis not present

## 2021-04-22 NOTE — Telephone Encounter (Signed)
Discussed labs and Ultrasound results  F/u in 6 months for labs and physical  6 month Mammogram and ultrasound as requested by yesterday's ultrasound to follow benign mass/cyst

## 2021-06-30 ENCOUNTER — Other Ambulatory Visit: Payer: Self-pay | Admitting: Nurse Practitioner

## 2021-06-30 DIAGNOSIS — I10 Essential (primary) hypertension: Secondary | ICD-10-CM

## 2021-08-24 ENCOUNTER — Telehealth: Payer: BC Managed Care – PPO | Admitting: Physician Assistant

## 2021-08-24 DIAGNOSIS — B9789 Other viral agents as the cause of diseases classified elsewhere: Secondary | ICD-10-CM | POA: Diagnosis not present

## 2021-08-24 DIAGNOSIS — J019 Acute sinusitis, unspecified: Secondary | ICD-10-CM | POA: Diagnosis not present

## 2021-08-24 MED ORDER — AZELASTINE HCL 0.1 % NA SOLN
1.0000 | Freq: Two times a day (BID) | NASAL | 0 refills | Status: DC
Start: 2021-08-24 — End: 2022-06-08

## 2021-08-24 NOTE — Progress Notes (Signed)
I have spent 5 minutes in review of e-visit questionnaire, review and updating patient chart, medical decision making and response to patient.   Titus Drone Cody Ranen Doolin, PA-C    

## 2021-08-24 NOTE — Progress Notes (Signed)
E-Visit for Sinus Problems ? ?We are sorry that you are not feeling well.  Here is how we plan to help! ? ?Based on what you have shared with me it looks like you have sinusitis.  Sinusitis is inflammation and infection in the sinus cavities of the head.  Based on your presentation I believe you most likely have Acute Viral Sinusitis.This is an infection most likely caused by a virus. There is not specific treatment for viral sinusitis other than to help you with the symptoms until the infection runs its course.  You may use an oral decongestant such as Mucinex D or if you have glaucoma or high blood pressure use plain Mucinex. Saline nasal spray help and can safely be used as often as needed for congestion, I have prescribed: Azelastine nasal spray 2 sprays in each nostril twice a day. Use this along with the Fluticasone spray.  ? ?Some authorities believe that zinc sprays or the use of Echinacea may shorten the course of your symptoms. ? ?Sinus infections are not as easily transmitted as other respiratory infection, however we still recommend that you avoid close contact with loved ones, especially the very young and elderly.  Remember to wash your hands thoroughly throughout the day as this is the number one way to prevent the spread of infection! ? ?Home Care: ?Only take medications as instructed by your medical team. ?Do not take these medications with alcohol. ?A steam or ultrasonic humidifier can help congestion.  You can place a towel over your head and breathe in the steam from hot water coming from a faucet. ?Avoid close contacts especially the very young and the elderly. ?Cover your mouth when you cough or sneeze. ?Always remember to wash your hands. ? ?Get Help Right Away If: ?You develop worsening fever or sinus pain. ?You develop a severe head ache or visual changes. ?Your symptoms persist after you have completed your treatment plan. ? ?Make sure you ?Understand these instructions. ?Will watch your  condition. ?Will get help right away if you are not doing well or get worse. ? ? ?Thank you for choosing an e-visit. ? ?Your e-visit answers were reviewed by a board certified advanced clinical practitioner to complete your personal care plan. Depending upon the condition, your plan could have included both over the counter or prescription medications. ? ?Please review your pharmacy choice. Make sure the pharmacy is open so you can pick up prescription now. If there is a problem, you may contact your provider through [employer] and have the prescription routed to another pharmacy.  Your safety is important to [place]. If you have drug allergies check your prescription carefully.  ? ?For the next 24 hours you can use MyChart to ask questions about today's visit, request a non-urgent call back, or ask for a work or school excuse. ?You will get an email in the next two days asking about your experience. I hope that your e-visit has been valuable and will speed your recovery. ? ? ?

## 2021-09-25 ENCOUNTER — Other Ambulatory Visit: Payer: Self-pay | Admitting: Nurse Practitioner

## 2021-09-25 DIAGNOSIS — E039 Hypothyroidism, unspecified: Secondary | ICD-10-CM

## 2021-09-26 ENCOUNTER — Other Ambulatory Visit: Payer: Self-pay | Admitting: Nurse Practitioner

## 2021-09-26 DIAGNOSIS — J301 Allergic rhinitis due to pollen: Secondary | ICD-10-CM

## 2021-10-01 DIAGNOSIS — R7309 Other abnormal glucose: Secondary | ICD-10-CM | POA: Diagnosis not present

## 2021-10-16 ENCOUNTER — Other Ambulatory Visit: Payer: Self-pay | Admitting: Nurse Practitioner

## 2021-10-16 DIAGNOSIS — N6320 Unspecified lump in the left breast, unspecified quadrant: Secondary | ICD-10-CM

## 2021-10-20 ENCOUNTER — Other Ambulatory Visit: Payer: BC Managed Care – PPO

## 2021-10-20 DIAGNOSIS — Z0000 Encounter for general adult medical examination without abnormal findings: Secondary | ICD-10-CM

## 2021-10-21 LAB — CMP12+LP+TP+TSH+6AC+CBC/D/PLT
ALT: 23 IU/L (ref 0–32)
AST: 23 IU/L (ref 0–40)
Albumin/Globulin Ratio: 2.1 (ref 1.2–2.2)
Albumin: 4.4 g/dL (ref 3.8–4.8)
Alkaline Phosphatase: 93 IU/L (ref 44–121)
BUN/Creatinine Ratio: 23 (ref 12–28)
BUN: 15 mg/dL (ref 8–27)
Basophils Absolute: 0.1 10*3/uL (ref 0.0–0.2)
Basos: 1 %
Bilirubin Total: 0.3 mg/dL (ref 0.0–1.2)
Calcium: 9 mg/dL (ref 8.7–10.3)
Chloride: 102 mmol/L (ref 96–106)
Chol/HDL Ratio: 4 ratio (ref 0.0–4.4)
Cholesterol, Total: 170 mg/dL (ref 100–199)
Creatinine, Ser: 0.66 mg/dL (ref 0.57–1.00)
EOS (ABSOLUTE): 0 10*3/uL (ref 0.0–0.4)
Eos: 0 %
Estimated CHD Risk: 0.9 times avg. (ref 0.0–1.0)
Free Thyroxine Index: 2 (ref 1.2–4.9)
GGT: 16 IU/L (ref 0–60)
Globulin, Total: 2.1 g/dL (ref 1.5–4.5)
Glucose: 126 mg/dL — ABNORMAL HIGH (ref 70–99)
HDL: 42 mg/dL (ref >39)
Hematocrit: 40.3 % (ref 34.0–46.6)
Hemoglobin: 14.1 g/dL (ref 11.1–15.9)
Immature Grans (Abs): 0 10*3/uL (ref 0.0–0.1)
Immature Granulocytes: 0 %
Iron: 77 ug/dL (ref 27–139)
LDH: 174 IU/L (ref 119–226)
LDL Chol Calc (NIH): 102 mg/dL — ABNORMAL HIGH (ref 0–99)
Lymphocytes Absolute: 1.9 10*3/uL (ref 0.7–3.1)
Lymphs: 37 %
MCH: 28.8 pg (ref 26.6–33.0)
MCHC: 35 g/dL (ref 31.5–35.7)
MCV: 82 fL (ref 79–97)
Monocytes Absolute: 0.5 10*3/uL (ref 0.1–0.9)
Monocytes: 10 %
Neutrophils Absolute: 2.6 10*3/uL (ref 1.4–7.0)
Neutrophils: 52 %
Phosphorus: 4.6 mg/dL — ABNORMAL HIGH (ref 3.0–4.3)
Platelets: 262 10*3/uL (ref 150–450)
Potassium: 4 mmol/L (ref 3.5–5.2)
RBC: 4.9 x10E6/uL (ref 3.77–5.28)
RDW: 13.2 % (ref 11.7–15.4)
Sodium: 140 mmol/L (ref 134–144)
T3 Uptake Ratio: 25 % (ref 24–39)
T4, Total: 7.8 ug/dL (ref 4.5–12.0)
TSH: 3.73 u[IU]/mL (ref 0.450–4.500)
Total Protein: 6.5 g/dL (ref 6.0–8.5)
Triglycerides: 150 mg/dL — ABNORMAL HIGH (ref 0–149)
Uric Acid: 5.8 mg/dL (ref 3.0–7.2)
VLDL Cholesterol Cal: 26 mg/dL (ref 5–40)
WBC: 5.1 10*3/uL (ref 3.4–10.8)
eGFR: 99 mL/min/{1.73_m2} (ref >59)

## 2021-10-21 LAB — HGB A1C W/O EAG: Hgb A1c MFr Bld: 6.2 % — ABNORMAL HIGH (ref 4.8–5.6)

## 2021-10-21 LAB — VITAMIN D 25 HYDROXY (VIT D DEFICIENCY, FRACTURES): Vit D, 25-Hydroxy: 31 ng/mL (ref 30.0–100.0)

## 2021-10-27 ENCOUNTER — Ambulatory Visit: Payer: BC Managed Care – PPO | Admitting: Nurse Practitioner

## 2021-10-27 ENCOUNTER — Encounter: Payer: Self-pay | Admitting: Nurse Practitioner

## 2021-10-27 VITALS — BP 130/90 | HR 85 | Temp 97.9°F | Resp 16 | Ht 61.0 in | Wt 190.0 lb

## 2021-10-27 DIAGNOSIS — Z1231 Encounter for screening mammogram for malignant neoplasm of breast: Secondary | ICD-10-CM

## 2021-10-27 DIAGNOSIS — E785 Hyperlipidemia, unspecified: Secondary | ICD-10-CM

## 2021-10-27 DIAGNOSIS — E039 Hypothyroidism, unspecified: Secondary | ICD-10-CM

## 2021-10-27 DIAGNOSIS — I10 Essential (primary) hypertension: Secondary | ICD-10-CM

## 2021-10-27 DIAGNOSIS — E559 Vitamin D deficiency, unspecified: Secondary | ICD-10-CM

## 2021-10-27 DIAGNOSIS — Z0000 Encounter for general adult medical examination without abnormal findings: Secondary | ICD-10-CM

## 2021-10-27 DIAGNOSIS — R7303 Prediabetes: Secondary | ICD-10-CM

## 2021-10-31 ENCOUNTER — Other Ambulatory Visit: Payer: Self-pay | Admitting: Nurse Practitioner

## 2021-10-31 DIAGNOSIS — R7309 Other abnormal glucose: Secondary | ICD-10-CM | POA: Diagnosis not present

## 2021-10-31 DIAGNOSIS — I10 Essential (primary) hypertension: Secondary | ICD-10-CM

## 2021-11-13 ENCOUNTER — Ambulatory Visit
Admission: RE | Admit: 2021-11-13 | Discharge: 2021-11-13 | Disposition: A | Discharge status: Home / Self Care | Payer: BC Managed Care – PPO | Source: Ambulatory Visit | Attending: Nurse Practitioner | Admitting: Nurse Practitioner

## 2021-11-13 DIAGNOSIS — N6321 Unspecified lump in the left breast, upper outer quadrant: Secondary | ICD-10-CM | POA: Diagnosis not present

## 2021-11-13 DIAGNOSIS — R928 Other abnormal and inconclusive findings on diagnostic imaging of breast: Secondary | ICD-10-CM | POA: Diagnosis not present

## 2021-11-13 DIAGNOSIS — N6320 Unspecified lump in the left breast, unspecified quadrant: Secondary | ICD-10-CM

## 2021-11-13 DIAGNOSIS — N6322 Unspecified lump in the left breast, upper inner quadrant: Secondary | ICD-10-CM | POA: Diagnosis not present

## 2021-11-25 ENCOUNTER — Encounter: Payer: Self-pay | Admitting: Medical

## 2021-11-25 ENCOUNTER — Ambulatory Visit: Payer: BC Managed Care – PPO | Admitting: Medical

## 2021-11-25 VITALS — BP 120/78 | HR 62 | Temp 97.5°F

## 2021-11-25 DIAGNOSIS — M549 Dorsalgia, unspecified: Secondary | ICD-10-CM

## 2021-11-25 MED ORDER — IBUPROFEN 800 MG PO TABS: 800.0000 mg | ORAL_TABLET | Freq: Three times a day (TID) | ORAL | 0 refills | Status: AC | PRN

## 2021-11-25 MED ORDER — CYCLOBENZAPRINE HCL 5 MG PO TABS: 5.0000 mg | ORAL_TABLET | Freq: Three times a day (TID) | ORAL | 0 refills | Status: DC | PRN

## 2021-11-25 NOTE — Progress Notes (Signed)
   Subjective:    Patient ID: Latasha Lopez, female    DOB: 03-07-1961, 61 y.o.   MRN: [id number]  HPI 61 yo female in non acute distress.   Review of Systems  Musculoskeletal:  Positive for back pain.  All other systems reviewed and are negative.     No radiation , no numbness or tingling. Objective:   Physical Exam Vitals reviewed.  Constitutional:      Appearance: Normal appearance.  Cardiovascular:     Rate and Rhythm: Regular rhythm.  Musculoskeletal:     Cervical back: Normal range of motion and neck supple.  Skin:    General: Skin is warm and dry.  Neurological:     Mental Status: She is alert.          Assessment & Plan:   Encounter Diagnosis  Name Primary?   Upper back pain on right side Yes   Meds ordered this encounter  Medications   cyclobenzaprine (FLEXERIL) 5 MG tablet    Sig: Take 1 tablet (5 mg total) by mouth 3 (three) times daily as needed for muscle spasms.    Dispense:  30 tablet    Refill:  0   ibuprofen (ADVIL) 800 MG tablet    Sig: Take 1 tablet (800 mg total) by mouth every 8 (eight) hours as needed. Take with food    Dispense:  30 tablet    Refill:  0    Return in  3-5 days if symptoms are not improving. Patient verbalizes understanding and has no questions at discharge.

## 2021-12-01 DIAGNOSIS — R7309 Other abnormal glucose: Secondary | ICD-10-CM | POA: Diagnosis not present

## 2022-01-01 DIAGNOSIS — R7309 Other abnormal glucose: Secondary | ICD-10-CM | POA: Diagnosis not present

## 2022-01-31 DIAGNOSIS — R7309 Other abnormal glucose: Secondary | ICD-10-CM | POA: Diagnosis not present

## 2022-02-08 ENCOUNTER — Ambulatory Visit (INDEPENDENT_AMBULATORY_CARE_PROVIDER_SITE_OTHER): Payer: Self-pay | Admitting: Physician Assistant

## 2022-02-08 DIAGNOSIS — R399 Unspecified symptoms and signs involving the genitourinary system: Secondary | ICD-10-CM

## 2022-02-08 MED ORDER — NITROFURANTOIN MONOHYD MACRO 100 MG PO CAPS: 100.0000 mg | ORAL_CAPSULE | Freq: Two times a day (BID) | ORAL | 0 refills | Status: AC

## 2022-02-08 NOTE — Progress Notes (Signed)
Virtual Visit Consent   Latasha Lopez, you are scheduled for a virtual visit with a [facility] provider today. Just as with appointments in the office, your consent must be obtained to participate. Your consent will be active for this visit and any virtual visit you may have with one of our providers in the next 365 days. If you have a MyChart account, a copy of this consent can be sent to you electronically.  As this is a virtual telephone visit which doesn't allow your provider to perform a traditional examination and may limit your provider's ability to fully assess your condition. If your provider identifies any concerns that need to be evaluated in person or the need to arrange testing (such as labs, EKG, etc.), we will make arrangements to do so. Although advances in technology are sophisticated, we cannot ensure that it will always work on either your end or our end. If the connection with a video visit is poor, the visit may have to be switched to a telephone visit. With either a video or telephone visit, we are not always able to ensure that we have a secure connection.  By engaging in this virtual visit, you consent to the provision of healthcare and authorize for your insurance to be billed (if applicable) for the services provided during this visit. Depending on your insurance coverage, you may receive a charge related to this service.  I need to obtain your verbal consent now. Are you willing to proceed with your visit today? Latasha Lopez has provided verbal consent on 02/08/2022 for a virtual visit (video or telephone). Johnna Acosta, [place]  Date: 02/08/2022 11:44 AM  Virtual Visit via Telephone Note   I, Johnna Acosta, connected with  Latasha Lopez  ([id number], February 09, 1961) on 02/08/22 at 11:30 AM EDT by a telephone and verified that I am speaking with the correct person using two identifiers.  Location: Patient: Virtual Visit Location Patient: Other: home Provider: Virtual  Visit Location Provider: Office/Clinic   I discussed the limitations of evaluation and management by telemedicine and the availability of in person appointments. The patient expressed understanding and agreed to proceed.    History of Present Illness: Latasha Lopez is a 61 y.o. who identifies as a female who was assigned female at birth, and is being seen today for urinary symptoms.  HPI: 61 y/o F presents for a virtual telephone visit for urinary symptoms x2-3 days. +urinary frequency, urgency. NO foul smell with voiding. She denies fever, back pain, vaginal discharge. +h/o uti, but not for the past few years.   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 Use in each nostril as directed (Patient not taking: Reported on 11/25/2021), Disp: 30 mL, Rfl: 0   cholecalciferol (VITAMIN D) 1000 units  tablet, Take 1,000 Units by mouth daily., Disp: , Rfl:    cyanocobalamin 50 MCG tablet, Take by mouth., Disp: , Rfl:    cyclobenzaprine (FLEXERIL) 5 MG tablet, Take 1 tablet (5 mg total) by mouth 3 (three) times daily as needed for muscle spasms., Disp: 30 tablet, Rfl: 0   fexofenadine (ALLEGRA) 60 MG tablet, Take 60 mg by mouth 2 (two) times daily.,  Disp: , Rfl:    fluticasone (FLONASE) 50 MCG/ACT nasal spray, SPRAY 2 SPRAYS INTO EACH NOSTRIL EVERY DAY, Disp: 48 mL, Rfl: 3   hydrochlorothiazide (HYDRODIURIL) 25 MG tablet, TAKE 1 TABLET (25 MG TOTAL) BY MOUTH DAILY., Disp: 90 tablet, Rfl: 1   ibuprofen (ADVIL) 800 MG tablet, Take 1 tablet (800 mg total) by mouth every 8 (eight) hours as needed. Take with food, Disp: 30 tablet, Rfl: 0   levothyroxine (SYNTHROID) 25 MCG tablet, TAKE 1 TABLET BY MOUTH EVERY DAY BEFORE BREAKFAST, Disp: 90 tablet, Rfl: 1   omega-3 acid ethyl esters (LOVAZA) 1 g capsule, Take by mouth 2 (two) times daily., Disp: , Rfl:   Observations/Objective: Patient is well-developed, well-nourished in no acute distress.  Resting comfortably  at home.  Head is normocephalic, atraumatic.  No labored breathing.  Speech is clear and coherent with logical content.  Patient is alert and oriented at baseline.    Assessment and Plan: 1. UTI symptoms - nitrofurantoin, macrocrystal-monohydrate, (MACROBID) 100 MG capsule; Take 1 capsule (100 mg total) by mouth 2 (two) times daily for 3 days.  Dispense: 6 capsule; Refill: 0  Increase fluids. Start medicine as prescribed. If symptom's don't improve then RTC for further evaluation and urine testing. Pt verbalized understanding and in agreement.    Follow Up Instructions: I discussed the assessment and treatment plan with the patient. The patient was provided an opportunity to ask questions and all were answered. The patient agreed with the plan and demonstrated an understanding of the instructions.  A copy of instructions were sent to the patient via MyChart unless otherwise noted below.    The patient was advised to call back or seek an in-person evaluation if the symptoms worsen or if the condition fails to improve as anticipated.  Time:  I spent 10 minutes with the patient via telehealth technology discussing the above problems/concerns.    Johnna Acosta, PA-C

## 2022-03-03 DIAGNOSIS — R7309 Other abnormal glucose: Secondary | ICD-10-CM | POA: Diagnosis not present

## 2022-03-08 ENCOUNTER — Ambulatory Visit (INDEPENDENT_AMBULATORY_CARE_PROVIDER_SITE_OTHER): Payer: Self-pay

## 2022-03-08 DIAGNOSIS — Z23 Encounter for immunization: Secondary | ICD-10-CM

## 2022-03-10 ENCOUNTER — Ambulatory Visit (INDEPENDENT_AMBULATORY_CARE_PROVIDER_SITE_OTHER): Payer: Self-pay | Admitting: Nurse Practitioner

## 2022-03-10 DIAGNOSIS — J069 Acute upper respiratory infection, unspecified: Secondary | ICD-10-CM

## 2022-03-10 NOTE — Progress Notes (Signed)
Virtual Visit Consent   Latasha Lopez, you are scheduled for a virtual visit with a [facility] provider today. Just as with appointments in the office, your consent must be obtained to participate.   I need to obtain your verbal consent now. Are you willing to proceed with your visit today? Latasha Lopez has provided verbal consent on 03/10/2022 for a virtual visit (video or telephone). Apolonio Schneiders, FNP  Date: 03/10/2022 8:37 AM  Virtual Visit via Video Note   I, Apolonio Schneiders, connected with  Latasha Lopez  ([id number], 1960/12/30) on 03/10/22 at  8:30 AM EST by telephone and verified that I am speaking with the correct person using two identifiers.  Location: Patient: Virtual Visit Location Patient: Home Provider: Virtual Visit Location Provider: Home Office   I discussed the limitations of evaluation and management by telemedicine and the availability of in person appointments. The patient expressed understanding and agreed to proceed.    History of Present Illness: Latasha Lopez is a 61 y.o. and is being seen today for nasal congestion, loss of voice and a cough that started yesterday.   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) Both patient and father negative for COVID  No fever    Problems:  Patient Active Problem List   Diagnosis Date Noted   Hypertriglyceridemia 09/21/2018   Hypothyroidism 09/21/2018   Essential hypertension 09/21/2018   Prediabetes 03/07/2018   H/O total hysterectomy 12/17/2016   Screening breast examination 08/31/2015   Routine general medical examination at a health care facility 08/31/2015   Post-menopausal atrophic vaginitis 05/28/2015   Encounter to establish care 05/26/2015    Allergies: No Known Allergies Medications:  Current Outpatient Medications:    azelastine (ASTELIN) 0.1 % nasal spray, Place 1 spray into both nostrils 2 (two) times daily. Use in each nostril as directed (Patient not taking: Reported on 11/25/2021), Disp:  30 mL, Rfl: 0   cholecalciferol (VITAMIN D) 1000 units tablet, Take 1,000 Units by mouth daily., Disp: , Rfl:    cyanocobalamin 50 MCG tablet, Take by mouth., Disp: , Rfl:    cyclobenzaprine (FLEXERIL) 5 MG tablet, Take 1 tablet (5 mg total) by mouth 3 (three) times daily as needed for muscle spasms., Disp: 30 tablet, Rfl: 0   fexofenadine (ALLEGRA) 60 MG tablet, Take 60 mg by mouth 2 (two) times daily., Disp: , Rfl:    fluticasone (FLONASE) 50 MCG/ACT nasal spray, SPRAY 2 SPRAYS INTO EACH NOSTRIL EVERY DAY, Disp: 48 mL, Rfl: 3   hydrochlorothiazide (HYDRODIURIL) 25 MG tablet, TAKE 1 TABLET (25 MG TOTAL) BY MOUTH DAILY., Disp: 90 tablet, Rfl: 1   ibuprofen (ADVIL) 800 MG tablet, Take 1 tablet (800 mg total) by mouth every 8 (eight) hours as needed. Take with food, Disp: 30 tablet, Rfl: 0   levothyroxine (SYNTHROID) 25 MCG tablet, TAKE 1 TABLET BY MOUTH EVERY DAY BEFORE BREAKFAST, Disp: 90 tablet, Rfl: 1   omega-3 acid ethyl esters (LOVAZA) 1 g capsule, Take by mouth 2 (two) times daily., Disp: , Rfl:   Observations/Objective: No labored breathing.  Speech is clear and coherent with logical content.  Patient is alert and oriented at baseline.    Assessment and Plan: 1. Viral upper respiratory tract infection  Continue to manage symptoms with over the counter medications as discussed  Follow up for new or worsening symptoms      Follow Up Instructions: I discussed the assessment and treatment plan with the patient. The patient was provided an  opportunity to ask questions and all were answered. The patient agreed with the plan and demonstrated an understanding of the instructions.    The patient was advised to call back or seek an in-person evaluation if the symptoms worsen or if the condition fails to improve as anticipated.  Time:  I spent 10 minutes with the patient via telehealth technology discussing the above problems/concerns.    Apolonio Schneiders, FNP

## 2022-03-22 ENCOUNTER — Other Ambulatory Visit: Payer: Self-pay | Admitting: Nurse Practitioner

## 2022-03-22 DIAGNOSIS — E039 Hypothyroidism, unspecified: Secondary | ICD-10-CM

## 2022-03-29 ENCOUNTER — Ambulatory Visit (INDEPENDENT_AMBULATORY_CARE_PROVIDER_SITE_OTHER): Payer: Self-pay | Admitting: Physician Assistant

## 2022-03-29 DIAGNOSIS — R052 Subacute cough: Secondary | ICD-10-CM

## 2022-03-29 DIAGNOSIS — J0100 Acute maxillary sinusitis, unspecified: Secondary | ICD-10-CM

## 2022-03-29 MED ORDER — AMOXICILLIN-POT CLAVULANATE 875-125 MG PO TABS: 1.0000 | ORAL_TABLET | Freq: Two times a day (BID) | ORAL | 0 refills | Status: AC

## 2022-03-29 MED ORDER — BENZONATATE 200 MG PO CAPS: 200.0000 mg | ORAL_CAPSULE | Freq: Two times a day (BID) | ORAL | 0 refills | Status: DC | PRN

## 2022-03-29 NOTE — Progress Notes (Signed)
Virtual Visit Consent   Latasha Lopez, you are scheduled for a virtual visit with a [facility] provider today. Just as with appointments in the office, your consent must be obtained to participate. Your consent will be active for this visit and any virtual visit you may have with one of our providers in the next 365 days. If you have a MyChart account, a copy of this consent can be sent to you electronically.  As this is a virtual telephone visit which doesn't allow your provider to perform a traditional examination and may limit your provider's ability to fully assess your condition. If your provider identifies any concerns that need to be evaluated in person or the need to arrange testing (such as labs, EKG, etc.), we will make arrangements to do so. Although advances in technology are sophisticated, we cannot ensure that it will always work on either your end or our end. If the connection with a video visit is poor, the visit may have to be switched to a telephone visit. With either a video or telephone visit, we are not always able to ensure that we have a secure connection.  By engaging in this virtual visit, you consent to the provision of healthcare and authorize for your insurance to be billed (if applicable) for the services provided during this visit. Depending on your insurance coverage, you may receive a charge related to this service.  I need to obtain your verbal consent now. Are you willing to proceed with your visit today? Latasha Lopez has provided verbal consent on 03/29/2022 for a virtual visit (video or telephone). Johnna Acosta, [place]  Date: 03/29/2022 3:57 PM  Virtual Visit via Telephone Note   I, Johnna Acosta, connected with  Latasha Lopez  ([id number], Apr 24, 1961) on 03/29/22 at  3:15 PM EST by a telephone and verified that I am speaking with the correct person using two identifiers.  Location: Patient: Virtual Visit Location Patient: Home Provider: Virtual Visit  Location Provider: Office/Clinic   I discussed the limitations of evaluation and management by telemedicine and the availability of in person appointments. The patient expressed understanding and agreed to proceed.    History of Present Illness: Latasha Lopez is a 61 y.o. who identifies as a female who was assigned female at birth, and is being seen today for sinus pain.  HPI: 61 y/o F presents to the clinic for c/o sinus pain and pressure x 3 weeks. Denies fever, body aches, SOB, CP. She has been taking otc Mucinex DM, Tessalon( almost out), Flonase nasal spray for her symptoms.   Sinus Problem    Problems:  Patient Active Problem List   Diagnosis Date Noted   Hypertriglyceridemia 09/21/2018   Hypothyroidism 09/21/2018   Essential hypertension 09/21/2018   Prediabetes 03/07/2018   H/O total hysterectomy 12/17/2016   Screening breast examination 08/31/2015   Routine general medical examination at a health care facility 08/31/2015   Post-menopausal atrophic vaginitis 05/28/2015   Encounter to establish care 05/26/2015    Allergies: No Known Allergies Medications:  Current Outpatient Medications:    amoxicillin-clavulanate (AUGMENTIN) 875-125 MG tablet, Take 1 tablet by mouth 2 (two) times daily for 5 days., Disp: 10 tablet, Rfl: 0   benzonatate (TESSALON) 200 MG capsule, Take 1 capsule (200 mg total) by mouth 2 (two) times daily as needed for cough., Disp: 20 capsule, Rfl: 0   azelastine (ASTELIN) 0.1 % nasal spray, Place 1 spray into both nostrils 2 (two) times daily. Use in  each nostril as directed (Patient not taking: Reported on 11/25/2021), Disp: 30 mL, Rfl: 0   cholecalciferol (VITAMIN D) 1000 units tablet, Take 1,000 Units by mouth daily., Disp: , Rfl:    cyanocobalamin 50 MCG tablet, Take by mouth., Disp: , Rfl:    cyclobenzaprine (FLEXERIL) 5 MG tablet, Take 1 tablet (5 mg total) by mouth 3 (three) times daily as needed for muscle spasms., Disp: 30 tablet, Rfl: 0    fexofenadine (ALLEGRA) 60 MG tablet, Take 60 mg by mouth 2 (two) times daily., Disp: , Rfl:    fluticasone (FLONASE) 50 MCG/ACT nasal spray, SPRAY 2 SPRAYS INTO EACH NOSTRIL EVERY DAY, Disp: 48 mL, Rfl: 3   hydrochlorothiazide (HYDRODIURIL) 25 MG tablet, TAKE 1 TABLET (25 MG TOTAL) BY MOUTH DAILY., Disp: 90 tablet, Rfl: 1   ibuprofen (ADVIL) 800 MG tablet, Take 1 tablet (800 mg total) by mouth every 8 (eight) hours as needed. Take with food, Disp: 30 tablet, Rfl: 0   levothyroxine (SYNTHROID) 25 MCG tablet, TAKE 1 TABLET BY MOUTH EVERY DAY BEFORE BREAKFAST, Disp: 90 tablet, Rfl: 1   omega-3 acid ethyl esters (LOVAZA) 1 g capsule, Take by mouth 2 (two) times daily., Disp: , Rfl:   Observations/Objective: Patient is well-developed, well-nourished in no acute distress.  Resting comfortably  at home.  Head is normocephalic, atraumatic.  No labored breathing.  Speech is clear and coherent with logical content.  Patient is alert and oriented at baseline.    Assessment and Plan: 1. Acute non-recurrent maxillary sinusitis - amoxicillin-clavulanate (AUGMENTIN) 875-125 MG tablet; Take 1 tablet by mouth 2 (two) times daily for 5 days.  Dispense: 10 tablet; Refill: 0  2. Subacute cough - benzonatate (TESSALON) 200 MG capsule; Take 1 capsule (200 mg total) by mouth 2 (two) times daily as needed for cough.  Dispense: 20 capsule; Refill: 0  Increase fluids Start a humidifier Take Tessalon and Augmentin as prescribed Continue to watch for worsening symptoms RTC prn.   Follow Up Instructions: I discussed the assessment and treatment plan with the patient. The patient was provided an opportunity to ask questions and all were answered. The patient agreed with the plan and demonstrated an understanding of the instructions.  A copy of instructions were sent to the patient via MyChart unless otherwise noted below.    The patient was advised to call back or seek an in-person evaluation if the symptoms  worsen or if the condition fails to improve as anticipated.  Time:  I spent 10 minutes with the patient via telehealth technology discussing the above problems/concerns.    Johnna Acosta, PA-C

## 2022-04-02 DIAGNOSIS — R7309 Other abnormal glucose: Secondary | ICD-10-CM | POA: Diagnosis not present

## 2022-04-14 ENCOUNTER — Encounter: Payer: Self-pay | Admitting: Nurse Practitioner

## 2022-04-15 ENCOUNTER — Telehealth: Payer: Self-pay | Admitting: Nurse Practitioner

## 2022-04-15 NOTE — Telephone Encounter (Signed)
Pt is calling to get a new patient appt. CB- CB- [phone]

## 2022-04-16 NOTE — Telephone Encounter (Signed)
Appt sch'd for 2.6.2024 with Dr Rosana Berger

## 2022-04-22 DIAGNOSIS — D2261 Melanocytic nevi of right upper limb, including shoulder: Secondary | ICD-10-CM | POA: Diagnosis not present

## 2022-04-22 DIAGNOSIS — D2262 Melanocytic nevi of left upper limb, including shoulder: Secondary | ICD-10-CM | POA: Diagnosis not present

## 2022-04-22 DIAGNOSIS — D2272 Melanocytic nevi of left lower limb, including hip: Secondary | ICD-10-CM | POA: Diagnosis not present

## 2022-04-22 DIAGNOSIS — D225 Melanocytic nevi of trunk: Secondary | ICD-10-CM | POA: Diagnosis not present

## 2022-05-03 DIAGNOSIS — R7309 Other abnormal glucose: Secondary | ICD-10-CM | POA: Diagnosis not present

## 2022-06-03 DIAGNOSIS — R7309 Other abnormal glucose: Secondary | ICD-10-CM | POA: Diagnosis not present

## 2022-06-04 ENCOUNTER — Other Ambulatory Visit: Payer: Self-pay

## 2022-06-04 DIAGNOSIS — R7303 Prediabetes: Secondary | ICD-10-CM

## 2022-06-04 DIAGNOSIS — E559 Vitamin D deficiency, unspecified: Secondary | ICD-10-CM

## 2022-06-04 DIAGNOSIS — Z0000 Encounter for general adult medical examination without abnormal findings: Secondary | ICD-10-CM

## 2022-06-04 DIAGNOSIS — E039 Hypothyroidism, unspecified: Secondary | ICD-10-CM

## 2022-06-05 LAB — CMP12+LP+TP+TSH+6AC+CBC/D/PLT: Immature Grans (Abs): 0 10*3/uL (ref 0.0–0.1)

## 2022-06-07 LAB — CMP12+LP+TP+TSH+6AC+CBC/D/PLT
ALT: 22 IU/L (ref 0–32)
AST: 20 IU/L (ref 0–40)
Albumin/Globulin Ratio: 1.8 (ref 1.2–2.2)
Albumin: 4.3 g/dL (ref 3.9–4.9)
Alkaline Phosphatase: 90 IU/L (ref 44–121)
BUN/Creatinine Ratio: 19 (ref 12–28)
BUN: 16 mg/dL (ref 8–27)
Basophils Absolute: 0.1 10*3/uL (ref 0.0–0.2)
Basos: 1 %
Bilirubin Total: 0.3 mg/dL (ref 0.0–1.2)
Calcium: 9.1 mg/dL (ref 8.7–10.3)
Chloride: 102 mmol/L (ref 96–106)
Chol/HDL Ratio: 3.4 ratio (ref 0.0–4.4)
Cholesterol, Total: 161 mg/dL (ref 100–199)
Creatinine, Ser: 0.85 mg/dL (ref 0.57–1.00)
EOS (ABSOLUTE): 0 10*3/uL (ref 0.0–0.4)
Eos: 0 %
Estimated CHD Risk: 0.5 times avg. (ref 0.0–1.0)
Free Thyroxine Index: 2 (ref 1.2–4.9)
GGT: 14 IU/L (ref 0–60)
Globulin, Total: 2.4 g/dL (ref 1.5–4.5)
Glucose: 128 mg/dL — ABNORMAL HIGH (ref 70–99)
HDL: 47 mg/dL (ref >39)
Hematocrit: 37.6 % (ref 34.0–46.6)
Hemoglobin: 12.5 g/dL (ref 11.1–15.9)
Immature Grans (Abs): 0 10*3/uL (ref 0.0–0.1)
Immature Granulocytes: 0 %
Iron: 35 ug/dL (ref 27–139)
LDH: 174 IU/L (ref 119–226)
LDL Chol Calc (NIH): 92 mg/dL (ref 0–99)
Lymphocytes Absolute: 1.9 10*3/uL (ref 0.7–3.1)
Lymphs: 36 %
MCH: 26.6 pg (ref 26.6–33.0)
MCHC: 33.2 g/dL (ref 31.5–35.7)
MCV: 80 fL (ref 79–97)
Monocytes Absolute: 0.5 10*3/uL (ref 0.1–0.9)
Monocytes: 9 %
Neutrophils Absolute: 2.8 10*3/uL (ref 1.4–7.0)
Neutrophils: 54 %
Phosphorus: 4 mg/dL (ref 3.0–4.3)
Platelets: 297 10*3/uL (ref 150–450)
Potassium: 3.9 mmol/L (ref 3.5–5.2)
RBC: 4.7 x10E6/uL (ref 3.77–5.28)
RDW: 12.9 % (ref 11.7–15.4)
Sodium: 142 mmol/L (ref 134–144)
T3 Uptake Ratio: 27 % (ref 24–39)
T4, Total: 7.5 ug/dL (ref 4.5–12.0)
TSH: 4.06 u[IU]/mL (ref 0.450–4.500)
Total Protein: 6.7 g/dL (ref 6.0–8.5)
Triglycerides: 124 mg/dL (ref 0–149)
Uric Acid: 6 mg/dL (ref 3.0–7.2)
VLDL Cholesterol Cal: 22 mg/dL (ref 5–40)
WBC: 5.3 10*3/uL (ref 3.4–10.8)
eGFR: 77 mL/min/{1.73_m2} (ref >59)

## 2022-06-07 LAB — B12 AND FOLATE PANEL
Folate: 7.2 ng/mL (ref >3.0)
Vitamin B-12: 953 pg/mL (ref 232–1245)

## 2022-06-07 LAB — HGB A1C W/O EAG: Hgb A1c MFr Bld: 6.2 % — ABNORMAL HIGH (ref 4.8–5.6)

## 2022-06-07 LAB — VITAMIN D 25 HYDROXY (VIT D DEFICIENCY, FRACTURES): Vit D, 25-Hydroxy: 32.1 ng/mL (ref 30.0–100.0)

## 2022-06-08 ENCOUNTER — Encounter: Payer: Self-pay | Admitting: Internal Medicine

## 2022-06-08 ENCOUNTER — Ambulatory Visit: Payer: BC Managed Care – PPO | Admitting: Internal Medicine

## 2022-06-08 VITALS — BP 128/82 | HR 96 | Temp 97.7°F | Resp 18 | Ht 61.0 in | Wt 187.9 lb

## 2022-06-08 DIAGNOSIS — E559 Vitamin D deficiency, unspecified: Secondary | ICD-10-CM

## 2022-06-08 DIAGNOSIS — E039 Hypothyroidism, unspecified: Secondary | ICD-10-CM | POA: Diagnosis not present

## 2022-06-08 DIAGNOSIS — Z1231 Encounter for screening mammogram for malignant neoplasm of breast: Secondary | ICD-10-CM

## 2022-06-08 DIAGNOSIS — E782 Mixed hyperlipidemia: Secondary | ICD-10-CM | POA: Diagnosis not present

## 2022-06-08 DIAGNOSIS — I10 Essential (primary) hypertension: Secondary | ICD-10-CM

## 2022-06-08 DIAGNOSIS — R7303 Prediabetes: Secondary | ICD-10-CM | POA: Diagnosis not present

## 2022-06-08 NOTE — Patient Instructions (Addendum)
It was great seeing you today!  Plan discussed at today's visit: -Mammogram ordered, can call to schedule over the summer  Follow up in: 6 months   Take care and let us know if you have any questions or concerns prior to your next visit.  Dr. Rosana Berger

## 2022-06-08 NOTE — Progress Notes (Signed)
New Patient Office Visit  Subjective    Patient ID: Latasha Lopez, female    DOB: 07/01/1960  Age: 62 y.o. MRN: [id number]  CC:  Chief Complaint  Patient presents with   Establish Care    HPI Latasha Lopez presents to establish care.  Hypertension: -Medications: HCTZ 25 mg - stable on this medication for years -Patient is compliant with above medications and reports no side effects. -Denies any SOB, CP, vision changes, LE edema or symptoms of hypotension  Hypothyroidism: -Medications: Levothyroxine 25 mcg -Patient is compliant with the above medication (s) at the above dose and reports no medication side effects.  -Denies weight changes, cold./heat intolerance, skin changes, anxiety/palpitations  -Last TSH: 06/04/22 4.060  Pre-Diabetes: -Last A1c 2/24 6.2% -Not currently on medications  HLD: -Medications: Lovaza 1 g BID OTC -Patient is compliant with above medications and reports no side effects.  -Last lipid panel: Lipid Panel     Component Value Date/Time   CHOL 161 06/04/2022 0840   TRIG 124 06/04/2022 0840   HDL 47 06/04/2022 0840   CHOLHDL 3.4 06/04/2022 0840   LDLCALC 92 06/04/2022 0840   LABVLDL 22 06/04/2022 0840   Vitamin D Deficiency: -Currently on Vitamin D supplements 1000 IU -Vitamin D 2/24 32.1  Health Maintenance: -Blood work UTD -Mammogram 7/23 Birads-2. Breast US -Colon cancer screening: colonoscopy 05/2017, repeat in 10 years  Outpatient Encounter Medications as of 06/08/2022  Medication Sig   cholecalciferol (VITAMIN D) 1000 units tablet Take 1,000 Units by mouth daily.   cyanocobalamin 50 MCG tablet Take by mouth.   fexofenadine (ALLEGRA) 60 MG tablet Take 60 mg by mouth 2 (two) times daily.   fluticasone (FLONASE) 50 MCG/ACT nasal spray SPRAY 2 SPRAYS INTO EACH NOSTRIL EVERY DAY   hydrochlorothiazide (HYDRODIURIL) 25 MG tablet TAKE 1 TABLET (25 MG TOTAL) BY MOUTH DAILY.   hydrOXYzine (ATARAX) 25 MG tablet Take 25 mg by mouth every 6  (six) hours as needed.   ibuprofen (ADVIL) 800 MG tablet Take 1 tablet (800 mg total) by mouth every 8 (eight) hours as needed. Take with food   levothyroxine (SYNTHROID) 25 MCG tablet TAKE 1 TABLET BY MOUTH EVERY DAY BEFORE BREAKFAST   omega-3 acid ethyl esters (LOVAZA) 1 g capsule Take by mouth 2 (two) times daily.   [DISCONTINUED] azelastine (ASTELIN) 0.1 % nasal spray Place 1 spray into both nostrils 2 (two) times daily. Use in each nostril as directed (Patient not taking: Reported on 11/25/2021)   [DISCONTINUED] benzonatate (TESSALON) 200 MG capsule Take 1 capsule (200 mg total) by mouth 2 (two) times daily as needed for cough.   [DISCONTINUED] cyclobenzaprine (FLEXERIL) 5 MG tablet Take 1 tablet (5 mg total) by mouth 3 (three) times daily as needed for muscle spasms.   No facility-administered encounter medications on file as of 06/08/2022.    Past Medical History:  Diagnosis Date   Allergy    History of chicken pox    Hypertension    Pre-diabetes    Thyroid disease     Past Surgical History:  Procedure Laterality Date   BREAST BIOPSY Left [phone]   neg   CESAREAN SECTION     TOTAL ABDOMINAL HYSTERECTOMY  05/04/2003    Family History  Problem Relation Age of Onset   Breast cancer Mother 22   Hypertension Mother    Fibroids Mother    Hypertension Father    Fibroids Sister    Diabetes Paternal Aunt     Social History  Socioeconomic History   Marital status: Married    Spouse name: Not on file   Number of children: Not on file   Years of education: Not on file   Highest education level: Not on file  Occupational History   Not on file  Tobacco Use   Smoking status: Never   Smokeless tobacco: Never  Substance and Sexual Activity   Alcohol use: No    Alcohol/week: 0.0 standard drinks of alcohol   Drug use: No   Sexual activity: Yes    Partners: Male    Birth control/protection: Surgical    Comment: Husband   Other Topics Concern   Not on file  Social  History Narrative   Married    Designer, industrial/product work at [employer]    2 Children    61 yo daughter and 19 yo son    Multivitamin, Fish Oil, Vitamin D    Caffeine- diet coke, tea    Social Determinants of Health   Financial Resource Strain: Not on file  Food Insecurity: Not on file  Transportation Needs: Not on file  Physical Activity: Not on file  Stress: Not on file  Social Connections: Not on file  Intimate Partner Violence: Not on file    Review of Systems  Constitutional:  Negative for chills and fever.  Eyes:  Negative for blurred vision.  Respiratory:  Negative for shortness of breath.   Cardiovascular:  Negative for chest pain.  Neurological:  Negative for headaches.        Objective    BP 128/82   Pulse 96   Temp 97.7 F (36.5 C)   Resp 18   Ht '5\' 1"'$  (1.549 m)   Wt 187 lb 14.4 oz (85.2 kg)   SpO2 98%   BMI 35.50 kg/m   Physical Exam Constitutional:      Appearance: Normal appearance.  HENT:     Head: Normocephalic and atraumatic.     Mouth/Throat:     Mouth: Mucous membranes are moist.     Pharynx: Oropharynx is clear.  Eyes:     Extraocular Movements: Extraocular movements intact.     Conjunctiva/sclera: Conjunctivae normal.     Pupils: Pupils are equal, round, and reactive to light.  Cardiovascular:     Rate and Rhythm: Normal rate and regular rhythm.  Pulmonary:     Effort: Pulmonary effort is normal.     Breath sounds: Normal breath sounds.  Musculoskeletal:     Right lower leg: No edema.     Left lower leg: No edema.  Skin:    General: Skin is warm and dry.  Neurological:     General: No focal deficit present.     Mental Status: She is alert. Mental status is at baseline.  Psychiatric:        Mood and Affect: Mood normal.        Behavior: Behavior normal.         Assessment & Plan:   1. Primary hypertension: Chronic and stable.  Patient has been on HCTZ 25 mg daily for years.  Her blood pressure is well-controlled  today.  2. Mixed hyperlipidemia: Reviewed most recent lipid panel from 06/04/2022 with the patient.  She is on over-the-counter omega-3 fatty acids.  Plan to continue recheck labs in 1 year.  3. Hypothyroidism, unspecified type: Stable.  Continue levothyroxine 25 mcg daily.  TSH and labs a few days ago normal.  4. Prediabetes: Last A1c the beginning of February  elevated at 6.2%.  Discussed with patient lifestyle changes, decreasing sugar and carbs in the diet.  Also briefly discussed starting a medication to help prevent progression to diabetes.  Patient would like to recheck A1c in 6 months and discussed medication if A1c is elevated at that time.  5. Vitamin D deficiency: Vitamin D borderline at 32 on most recent labs.  Will increase over-the-counter vitamin D supplements to 2000 IUs daily.  6. Encounter for screening mammogram for malignant neoplasm of breast: Mammogram ordered and will be scheduled for over the summer.  - MM Digital Screening; Future   Return in about 6 months (around 12/07/2022).   Teodora Medici, DO

## 2022-06-23 ENCOUNTER — Other Ambulatory Visit: Payer: Self-pay | Admitting: Nurse Practitioner

## 2022-06-23 DIAGNOSIS — I10 Essential (primary) hypertension: Secondary | ICD-10-CM

## 2022-07-02 DIAGNOSIS — R7309 Other abnormal glucose: Secondary | ICD-10-CM | POA: Diagnosis not present

## 2022-08-02 DIAGNOSIS — R7309 Other abnormal glucose: Secondary | ICD-10-CM | POA: Diagnosis not present

## 2022-08-13 ENCOUNTER — Ambulatory Visit (INDEPENDENT_AMBULATORY_CARE_PROVIDER_SITE_OTHER): Payer: Self-pay | Admitting: Adult Health

## 2022-08-13 DIAGNOSIS — J0140 Acute pansinusitis, unspecified: Secondary | ICD-10-CM

## 2022-08-13 MED ORDER — AMOXICILLIN-POT CLAVULANATE 875-125 MG PO TABS: 1.0000 | ORAL_TABLET | Freq: Two times a day (BID) | ORAL | 0 refills | Status: DC

## 2022-08-13 NOTE — Progress Notes (Signed)
Virtual Visit Consent   Latasha Lopez, you are scheduled for a virtual visit with a [facility] provider today. Just as with appointments in the office, your consent must be obtained to participate.   I need to obtain your verbal consent now. Are you willing to proceed with your visit today? Latasha Lopez has provided verbal consent on 08/13/2022 for a virtual visit (video or telephone). Johnna Acosta, NP  Date: 08/13/2022 10:03 AM  Virtual Visit via Video Note   I, Johnna Acosta, connected with  Latasha Lopez  ([id number], 1960/12/30) on 08/13/22 at 10:00 AM EDT by telephone and verified that I am speaking with the correct person using two identifiers.  Location: Patient: Virtual Visit Location Patient: Home Provider: Virtual Visit Location Provider: Office/Clinic   I discussed the limitations of evaluation and management by telemedicine and the availability of in person appointments. The patient expressed understanding and agreed to proceed.    History of Present Illness: Latasha Lopez is a 62 y.o. and is being seen today for sinus pressure.  She reports allergy symptoms have been bad for just over a week.  Over the last 2=3 days it has become worse with pressure in her face/ear, as well as discolored mucous. History of sinus infections during spring. .  Problems:  Patient Active Problem List   Diagnosis Date Noted   Hypertriglyceridemia 09/21/2018   Hypothyroidism 09/21/2018   Essential hypertension 09/21/2018   Prediabetes 03/07/2018   H/O total hysterectomy 12/17/2016   Screening breast examination 08/31/2015   Routine general medical examination at a health care facility 08/31/2015   Post-menopausal atrophic vaginitis 05/28/2015   Encounter to establish care 05/26/2015    Allergies: No Known Allergies Medications:  Current Outpatient Medications:    cholecalciferol (VITAMIN D) 1000 units tablet, Take 1,000 Units by mouth daily., Disp: , Rfl:    cyanocobalamin  50 MCG tablet, Take by mouth., Disp: , Rfl:    fexofenadine (ALLEGRA) 60 MG tablet, Take 60 mg by mouth 2 (two) times daily., Disp: , Rfl:    fluticasone (FLONASE) 50 MCG/ACT nasal spray, SPRAY 2 SPRAYS INTO EACH NOSTRIL EVERY DAY, Disp: 48 mL, Rfl: 3   hydrochlorothiazide (HYDRODIURIL) 25 MG tablet, TAKE 1 TABLET (25 MG TOTAL) BY MOUTH DAILY., Disp: 90 tablet, Rfl: 1   hydrOXYzine (ATARAX) 25 MG tablet, Take 25 mg by mouth every 6 (six) hours as needed., Disp: , Rfl:    ibuprofen (ADVIL) 800 MG tablet, Take 1 tablet (800 mg total) by mouth every 8 (eight) hours as needed. Take with food, Disp: 30 tablet, Rfl: 0   levothyroxine (SYNTHROID) 25 MCG tablet, TAKE 1 TABLET BY MOUTH EVERY DAY BEFORE BREAKFAST, Disp: 90 tablet, Rfl: 1   omega-3 acid ethyl esters (LOVAZA) 1 g capsule, Take by mouth 2 (two) times daily., Disp: , Rfl:   Observations/Objective: No labored breathing.  Speech is clear and coherent with logical content.  Patient is alert and oriented at baseline.    Assessment and Plan: There are no diagnoses linked to this encounter. 1. Acute non-recurrent pansinusitis Patient Instructions: -Take complete course of antibiotics as prescribed.  Take with food.  -Try Flonase/Fluticasone nasal spray, 2 sprays to each nostril once a day. -You can try using a neti pot or nasal saline rinse product to help clear mucus congestion. -Rest and stay well hydrated (by drinking water and other liquids). Avoid/limit caffeine. -Take over-the-counter medicines (i.e. Mucinex, decongestant, Ibuprofen or Tylenol, cough suppressant) to help relieve  your symptoms. -For your cough, use cough drops/throat lozenges, gargle warm salt water and/or drink warm liquids (like tea with honey). -Send my chart message to provider or schedule return visit as needed for new/worsening symptoms or if symptoms do not improve as discussed with antibiotic and other recommended treatment.   - amoxicillin-clavulanate (AUGMENTIN)  875-125 MG tablet; Take 1 tablet by mouth 2 (two) times daily.  Dispense: 20 tablet; Refill: 0     Follow Up Instructions: I discussed the assessment and treatment plan with the patient. The patient was provided an opportunity to ask questions and all were answered. The patient agreed with the plan and demonstrated an understanding of the instructions.    The patient was advised to call back or seek an in-person evaluation if the symptoms worsen or if the condition fails to improve as anticipated.  Time:  I spent 10 minutes with the patient via telehealth technology discussing the above problems/concerns.    Johnna Acosta, NP

## 2022-09-01 DIAGNOSIS — R7309 Other abnormal glucose: Secondary | ICD-10-CM | POA: Diagnosis not present

## 2022-09-28 ENCOUNTER — Other Ambulatory Visit: Payer: Self-pay | Admitting: Internal Medicine

## 2022-09-28 DIAGNOSIS — Z1231 Encounter for screening mammogram for malignant neoplasm of breast: Secondary | ICD-10-CM

## 2022-10-02 DIAGNOSIS — R7309 Other abnormal glucose: Secondary | ICD-10-CM | POA: Diagnosis not present

## 2022-10-07 ENCOUNTER — Other Ambulatory Visit: Payer: Self-pay | Admitting: Nurse Practitioner

## 2022-10-07 DIAGNOSIS — E039 Hypothyroidism, unspecified: Secondary | ICD-10-CM

## 2022-11-01 ENCOUNTER — Encounter: Payer: Self-pay | Admitting: Internal Medicine

## 2022-11-01 DIAGNOSIS — R7309 Other abnormal glucose: Secondary | ICD-10-CM | POA: Diagnosis not present

## 2022-11-05 ENCOUNTER — Other Ambulatory Visit: Payer: Self-pay | Admitting: Internal Medicine

## 2022-11-05 DIAGNOSIS — I10 Essential (primary) hypertension: Secondary | ICD-10-CM

## 2022-11-05 DIAGNOSIS — R7303 Prediabetes: Secondary | ICD-10-CM

## 2022-11-10 ENCOUNTER — Other Ambulatory Visit: Payer: Self-pay

## 2022-11-10 DIAGNOSIS — I10 Essential (primary) hypertension: Secondary | ICD-10-CM

## 2022-11-10 DIAGNOSIS — R7303 Prediabetes: Secondary | ICD-10-CM

## 2022-11-11 LAB — BASIC METABOLIC PANEL
BUN/Creatinine Ratio: 23 (ref 12–28)
BUN: 18 mg/dL (ref 8–27)
CO2: 22 mmol/L (ref 20–29)
Calcium: 8.9 mg/dL (ref 8.7–10.3)
Chloride: 106 mmol/L (ref 96–106)
Creatinine, Ser: 0.78 mg/dL (ref 0.57–1.00)
Glucose: 112 mg/dL — ABNORMAL HIGH (ref 70–99)
Potassium: 4 mmol/L (ref 3.5–5.2)
Sodium: 145 mmol/L — ABNORMAL HIGH (ref 134–144)
eGFR: 85 mL/min/{1.73_m2} (ref >59)

## 2022-11-11 LAB — HEMOGLOBIN A1C
Est. average glucose Bld gHb Est-mCnc: 131 mg/dL
Hgb A1c MFr Bld: 6.2 % — ABNORMAL HIGH (ref 4.8–5.6)

## 2022-11-15 ENCOUNTER — Ambulatory Visit
Admission: RE | Admit: 2022-11-15 | Discharge: 2022-11-15 | Disposition: A | Discharge status: Home / Self Care | Payer: BC Managed Care – PPO | Source: Ambulatory Visit | Attending: Internal Medicine | Admitting: Internal Medicine

## 2022-11-15 DIAGNOSIS — Z1231 Encounter for screening mammogram for malignant neoplasm of breast: Secondary | ICD-10-CM | POA: Insufficient documentation

## 2022-12-02 DIAGNOSIS — R7309 Other abnormal glucose: Secondary | ICD-10-CM | POA: Diagnosis not present

## 2022-12-07 ENCOUNTER — Ambulatory Visit: Payer: BC Managed Care – PPO | Admitting: Internal Medicine

## 2022-12-08 NOTE — Progress Notes (Deleted)
Established Patient Office Visit  Subjective    Patient ID: Latasha Lopez, female    DOB: 05-23-1960  Age: 62 y.o. MRN: [id number]  CC:  No chief complaint on file.   HPI Latasha Lopez presents to follow up on chronic medical conditions.   Hypertension: -Medications: HCTZ 25 mg - stable on this medication for years -Patient is compliant with above medications and reports no side effects. -Denies any SOB, CP, vision changes, LE edema or symptoms of hypotension  Hypothyroidism: -Medications: Levothyroxine 25 mcg -Patient is compliant with the above medication (s) at the above dose and reports no medication side effects.  -Denies weight changes, cold./heat intolerance, skin changes, anxiety/palpitations  -Last TSH: 06/04/22 4.060  Pre-Diabetes: -Last A1c 7/24 6.2% -Not currently on medications  HLD: -Medications: Lovaza 1 g BID OTC -Patient is compliant with above medications and reports no side effects.  -Last lipid panel: Lipid Panel     Component Value Date/Time   CHOL 161 06/04/2022 0840   TRIG 124 06/04/2022 0840   HDL 47 06/04/2022 0840   CHOLHDL 3.4 06/04/2022 0840   LDLCALC 92 06/04/2022 0840   LABVLDL 22 06/04/2022 0840   Vitamin D Deficiency: -Currently on Vitamin D supplements 1000 IU -Vitamin D 2/24 32.1  Health Maintenance: -Blood work UTD -Mammogram 7/23 Birads-2. Breast US -Colon cancer screening: colonoscopy 05/2017, repeat in 10 years  Outpatient Encounter Medications as of 12/09/2022  Medication Sig   amoxicillin-clavulanate (AUGMENTIN) 875-125 MG tablet Take 1 tablet by mouth 2 (two) times daily.   cholecalciferol (VITAMIN D) 1000 units tablet Take 1,000 Units by mouth daily.   cyanocobalamin 50 MCG tablet Take by mouth.   fexofenadine (ALLEGRA) 60 MG tablet Take 60 mg by mouth 2 (two) times daily.   fluticasone (FLONASE) 50 MCG/ACT nasal spray SPRAY 2 SPRAYS INTO EACH NOSTRIL EVERY DAY   hydrochlorothiazide (HYDRODIURIL) 25 MG tablet TAKE 1  TABLET (25 MG TOTAL) BY MOUTH DAILY.   hydrOXYzine (ATARAX) 25 MG tablet Take 25 mg by mouth every 6 (six) hours as needed.   ibuprofen (ADVIL) 800 MG tablet Take 1 tablet (800 mg total) by mouth every 8 (eight) hours as needed. Take with food   levothyroxine (SYNTHROID) 25 MCG tablet TAKE 1 TABLET BY MOUTH EVERY DAY BEFORE BREAKFAST   omega-3 acid ethyl esters (LOVAZA) 1 g capsule Take by mouth 2 (two) times daily.   No facility-administered encounter medications on file as of 12/09/2022.    Past Medical History:  Diagnosis Date   Allergy    History of chicken pox    Hypertension    Pre-diabetes    Thyroid disease     Past Surgical History:  Procedure Laterality Date   BREAST BIOPSY Left [phone]   neg   CESAREAN SECTION     TOTAL ABDOMINAL HYSTERECTOMY  05/04/2003    Family History  Problem Relation Age of Onset   Breast cancer Mother 33   Hypertension Mother    Fibroids Mother    Hypertension Father    Fibroids Sister    Diabetes Paternal Aunt     Social History   Socioeconomic History   Marital status: Married    Spouse name: Not on file   Number of children: Not on file   Years of education: Not on file   Highest education level: Not on file  Occupational History   Not on file  Tobacco Use   Smoking status: Never   Smokeless tobacco: Never  Substance and  Sexual Activity   Alcohol use: No    Alcohol/week: 0.0 standard drinks of alcohol   Drug use: No   Sexual activity: Yes    Partners: Male    Birth control/protection: Surgical    Comment: Husband   Other Topics Concern   Not on file  Social History Narrative   Married    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  Eyes:  Negative for blurred vision.  Respiratory:  Negative for shortness of breath.   Cardiovascular:  Negative for chest pain.  Neurological:  Negative for headaches.        Objective    There were no vitals taken for this visit.  Physical Exam Constitutional:      Appearance: Normal appearance.  HENT:     Head: Normocephalic and atraumatic.     Mouth/Throat:     Mouth: Mucous membranes are moist.     Pharynx: Oropharynx is clear.  Eyes:     Extraocular Movements: Extraocular movements intact.     Conjunctiva/sclera: Conjunctivae normal.     Pupils: Pupils are equal, round, and reactive to light.  Cardiovascular:     Rate and Rhythm: Normal rate and regular rhythm.  Pulmonary:     Effort: Pulmonary effort is normal.     Breath sounds: Normal breath sounds.  Musculoskeletal:     Right lower leg: No edema.     Left lower leg: No edema.  Skin:    General: Skin is warm and dry.  Neurological:     General: No focal deficit present.     Mental Status: She is alert. Mental status is at baseline.  Psychiatric:        Mood and Affect: Mood normal.        Behavior: Behavior normal.         Assessment & Plan:   1. Primary hypertension: Chronic and stable.  Patient has been on HCTZ 25 mg daily for years.  Her blood pressure is well-controlled today.  2. Mixed hyperlipidemia: Reviewed most recent lipid panel from 06/04/2022 with the patient.  She is on over-the-counter omega-3 fatty acids.  Plan to continue recheck labs in 1 year.  3. Hypothyroidism, unspecified type: Stable.  Continue levothyroxine 25 mcg daily.  TSH and labs a few days ago normal.  4. Prediabetes: Last A1c the beginning of February elevated at 6.2%.  Discussed with patient lifestyle changes, decreasing sugar and carbs in the diet.  Also  briefly discussed starting a medication to help prevent progression to diabetes.  Patient would like to recheck A1c in 6 months and discussed medication if A1c is elevated at that time.  5. Vitamin D deficiency: Vitamin D borderline at 32 on most recent labs.  Will increase over-the-counter vitamin D supplements to 2000 IUs daily.  6. Encounter for screening mammogram for malignant neoplasm of breast: Mammogram ordered and will be scheduled for over the summer.  - MM  Digital Screening; Future   No follow-ups on file.   Margarita Mail, DO

## 2022-12-09 ENCOUNTER — Encounter: Payer: Self-pay | Admitting: Internal Medicine

## 2022-12-09 ENCOUNTER — Encounter: Payer: BC Managed Care – PPO | Admitting: Internal Medicine

## 2022-12-10 NOTE — Progress Notes (Signed)
This encounter was created in error - please disregard.

## 2022-12-20 NOTE — Progress Notes (Signed)
Name: Latasha Lopez   MRN: [id number]    DOB: [birth year]   Date:12/31/2022       Progress Note  Subjective  Chief Complaint  Chief Complaint  Patient presents with   Annual Exam    HPI  Patient presents for annual CPE.  Diet: eats salad, eats out for dinner daily, yogurt for breakfast  Exercise: not now but had been walking previously to going to the beach, has a row machine   Last Eye Exam: UTD Last Dental Exam: UTD   Depression: Phq 9 is  negative    12/31/2022    3:15 PM 06/08/2022    2:39 PM 04/05/2019   10:13 AM 08/21/2015    3:28 PM  Depression screen PHQ 2/9  Decreased Interest 0 0 0 0  Down, Depressed, Hopeless 0 0 0 0  PHQ - 2 Score 0 0 0 0  Altered sleeping 0 0    Tired, decreased energy 0 0    Change in appetite 0 0    Feeling bad or failure about yourself  0 0    Trouble concentrating 0 0    Moving slowly or fidgety/restless 0 0    Suicidal thoughts 0 0    PHQ-9 Score 0 0    Difficult doing work/chores Not difficult at all Not difficult at all     Hypertension: BP Readings from Last 3 Encounters:  12/31/22 128/82  06/08/22 128/82  11/25/21 120/78   Obesity: Wt Readings from Last 3 Encounters:  12/31/22 191 lb 14.4 oz (87 kg)  06/08/22 187 lb 14.4 oz (85.2 kg)  10/27/21 190 lb (86.2 kg)   BMI Readings from Last 3 Encounters:  12/31/22 36.26 kg/m  06/08/22 35.50 kg/m  10/27/21 35.90 kg/m     Vaccines:   HPV: n/a Tdap: 2021 Shingrix: Discussed obtaining at the pharmacy Pneumonia: Discussed obtaining at age 89 Flu: will get through work COVID-19: 2 original vaccines in 2021  Hep C Screening: complete STD testing and prevention (HIV/chl/gon/syphilis): HIV complete Intimate partner violence: negative screen  LMP: History of hysterectomy in 2004, no vaginal bleeding since that time Discussed importance of follow up if any post-menopausal bleeding: yes  Incontinence Symptoms: negative for symptoms   Breast cancer:  - Last Mammogram:  11/15/22  Osteoporosis Prevention : Discussed high calcium and vitamin D supplementation, weight bearing exercises Bone density :not applicable   Cervical cancer screening: Complete - Hx of complete hysterectomy in 2004 secondary to fibroids   Skin cancer: Discussed monitoring for atypical lesions  Colorectal cancer: 05/2017, repeat in 10 years Lung cancer:  Low Dose CT Chest recommended if Age 8-80 years, 20 pack-year currently smoking OR have quit w/in 15years. Patient does not qualify for screen   ECG: 04/05/19  Advanced Care Planning: A voluntary discussion about advance care planning including the explanation and discussion of advance directives.  Discussed health care proxy and Living will, and the patient was able to identify a health care proxy as  husband Latasha Lopez.  Patient does have a living will and power of attorney of health care   Lipids: Lab Results  Component Value Date   CHOL 161 06/04/2022   CHOL 170 10/20/2021   CHOL 160 04/14/2021   Lab Results  Component Value Date   HDL 47 06/04/2022   HDL 42 10/20/2021   HDL 45 04/14/2021   Lab Results  Component Value Date   LDLCALC 92 06/04/2022   LDLCALC 102 (H) 10/20/2021   LDLCALC 94  04/14/2021   Lab Results  Component Value Date   TRIG 124 06/04/2022   TRIG 150 (H) 10/20/2021   TRIG 118 04/14/2021   Lab Results  Component Value Date   CHOLHDL 3.4 06/04/2022   CHOLHDL 4.0 10/20/2021   CHOLHDL 3.6 04/14/2021   No results found for: "LDLDIRECT"  Glucose: Glucose  Date Value Ref Range Status  11/10/2022 112 (H) 70 - 99 mg/dL Final  40/98/1191 478 (H) 70 - 99 mg/dL Final  29/56/2130 865 (H) 70 - 99 mg/dL Final    Patient Active Problem List   Diagnosis Date Noted   Hypertriglyceridemia 09/21/2018   Hypothyroidism 09/21/2018   Essential hypertension 09/21/2018   Prediabetes 03/07/2018   H/O total hysterectomy 12/17/2016   Screening breast examination 08/31/2015   Routine general medical  examination at a health care facility 08/31/2015   Post-menopausal atrophic vaginitis 05/28/2015   Encounter to establish care 05/26/2015    Past Surgical History:  Procedure Laterality Date   BREAST BIOPSY Left [phone]   neg   CESAREAN SECTION     TOTAL ABDOMINAL HYSTERECTOMY  05/04/2003    Family History  Problem Relation Age of Onset   Breast cancer Mother 22   Hypertension Mother    Fibroids Mother    Hypertension Father    Fibroids Sister    Diabetes Paternal Aunt     Social History   Socioeconomic History   Marital status: Married    Spouse name: Not on file   Number of children: Not on file   Years of education: Not on file   Highest education level: Bachelor's degree (e.g., BA, AB, BS)  Occupational History   Not on file  Tobacco Use   Smoking status: Never   Smokeless tobacco: Never  Substance and Sexual Activity   Alcohol use: No    Alcohol/week: 0.0 standard drinks of alcohol   Drug use: No   Sexual activity: Yes    Partners: Male    Birth control/protection: Surgical    Comment: Husband   Other Topics Concern   Not on file  Social History Narrative   Married    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[employer]    2 Children    52 yo daughter and 81 yo son    Multivitamin, Fish Oil, Vitamin D    Caffeine- diet coke, tea    Social Determinants of Health   Financial Resource Strain: Low Risk  (12/31/2022)   Overall Financial Resource Strain (CARDIA)    Difficulty of Paying Living Expenses: Not very hard  Food Insecurity: No Food Insecurity (12/31/2022)   Hunger Vital Sign    Worried About Running Out of Food in the Last Year: Never true    Ran Out of Food in the Last Year: Never true  Transportation Needs: No Transportation Needs (12/31/2022)   PRAPARE - Administrator, Civil Service (Medical): No    Lack of Transportation (Non-Medical): No  Physical Activity: Insufficiently Active (12/31/2022)   Exercise Vital Sign    Days of Exercise per  Week: 2 days    Minutes of Exercise per Session: 10 min  Stress: No Stress Concern Present (12/31/2022)   [employer] of Occupational Health - Occupational Stress Questionnaire    Feeling of Stress : Only a little  Social Connections: Socially Integrated (12/31/2022)   Social Connection and Isolation Panel [NHANES]    Frequency of Communication with Friends and Family: More than three times a week  Frequency of Social Gatherings with Friends and Family: More than three times a week    Attends Religious Services: More than 4 times per year    Active Member of Clubs or Organizations: Yes    Attends Engineer, structural: More than 4 times per year    Marital Status: Married  Catering manager Violence: Not on file     Current Outpatient Medications:    amoxicillin-clavulanate (AUGMENTIN) 875-125 MG tablet, Take 1 tablet by mouth 2 (two) times daily., Disp: 20 tablet, Rfl: 0   cholecalciferol (VITAMIN D) 1000 units tablet, Take 1,000 Units by mouth daily., Disp: , Rfl:    cyanocobalamin 50 MCG tablet, Take by mouth., Disp: , Rfl:    fexofenadine (ALLEGRA) 60 MG tablet, Take 60 mg by mouth 2 (two) times daily., Disp: , Rfl:    fluticasone (FLONASE) 50 MCG/ACT nasal spray, SPRAY 2 SPRAYS INTO EACH NOSTRIL EVERY DAY, Disp: 48 mL, Rfl: 3   hydrochlorothiazide (HYDRODIURIL) 25 MG tablet, TAKE 1 TABLET (25 MG TOTAL) BY MOUTH DAILY., Disp: 90 tablet, Rfl: 1   hydrOXYzine (ATARAX) 25 MG tablet, Take 25 mg by mouth every 6 (six) hours as needed., Disp: , Rfl:    ibuprofen (ADVIL) 800 MG tablet, Take 1 tablet (800 mg total) by mouth every 8 (eight) hours as needed. Take with food, Disp: 30 tablet, Rfl: 0   levothyroxine (SYNTHROID) 25 MCG tablet, TAKE 1 TABLET BY MOUTH EVERY DAY BEFORE BREAKFAST, Disp: 90 tablet, Rfl: 1   omega-3 acid ethyl esters (LOVAZA) 1 g capsule, Take by mouth 2 (two) times daily., Disp: , Rfl:   No Known Allergies   Review of Systems  All other systems  reviewed and are negative.   Objective  Vitals:   12/31/22 1512  BP: 128/82  Pulse: 88  Temp: 97.8 F (36.6 C)  SpO2: 98%  Weight: 191 lb 14.4 oz (87 kg)  Height: 5\' 1"  (1.549 m)    Body mass index is 36.26 kg/m.  Physical Exam Constitutional:      Appearance: Normal appearance.  HENT:     Head: Normocephalic and atraumatic.     Mouth/Throat:     Mouth: Mucous membranes are moist.     Pharynx: Oropharynx is clear.  Eyes:     Extraocular Movements: Extraocular movements intact.     Conjunctiva/sclera: Conjunctivae normal.     Pupils: Pupils are equal, round, and reactive to light.  Neck:     Vascular: No carotid bruit.     Comments: No thyromegaly Cardiovascular:     Rate and Rhythm: Normal rate and regular rhythm.  Pulmonary:     Effort: Pulmonary effort is normal.     Breath sounds: Normal breath sounds.  Musculoskeletal:     Cervical back: No tenderness.     Right lower leg: No edema.     Left lower leg: No edema.  Lymphadenopathy:     Cervical: No cervical adenopathy.  Skin:    General: Skin is warm and dry.  Neurological:     General: No focal deficit present.     Mental Status: She is alert. Mental status is at baseline.  Psychiatric:        Mood and Affect: Mood normal.        Behavior: Behavior normal.     Recent Results (from the past 2160 hour(s))  HgB A1c     Status: Abnormal   Collection Time: 11/10/22  8:26 AM  Result Value Ref Range  Hgb A1c MFr Bld 6.2 (H) 4.8 - 5.6 %    Comment:          Prediabetes: 5.7 - 6.4          Diabetes: >6.4          Glycemic control for adults with diabetes: <7.0    Est. average glucose Bld gHb Est-mCnc 131 mg/dL  Basic metabolic panel     Status: Abnormal   Collection Time: 11/10/22  8:30 AM  Result Value Ref Range   Glucose 112 (H) 70 - 99 mg/dL   BUN 18 8 - 27 mg/dL   Creatinine, Ser 8.65 0.57 - 1.00 mg/dL   eGFR 85 >78 IO/NGE/9.52   BUN/Creatinine Ratio 23 12 - 28   Sodium 145 (H) 134 - 144 mmol/L    Potassium 4.0 3.5 - 5.2 mmol/L   Chloride 106 96 - 106 mmol/L   CO2 22 20 - 29 mmol/L   Calcium 8.9 8.7 - 10.3 mg/dL     Fall Risk:    8/41/3244    3:15 PM 06/08/2022    2:39 PM  Fall Risk   Falls in the past year? 0 0  Number falls in past yr: 0 0  Injury with Fall? 0 0    Functional Status Survey: Is the patient deaf or have difficulty hearing?: No Does the patient have difficulty seeing, even when wearing glasses/contacts?: No Does the patient have difficulty concentrating, remembering, or making decisions?: No Does the patient have difficulty walking or climbing stairs?: No Does the patient have difficulty dressing or bathing?: No Does the patient have difficulty doing errands alone such as visiting a doctor's office or shopping?: No   Assessment & Plan  1. Annual physical exam: Annual physical completed, health maintenance reviewed.  2. Essential hypertension: Stable, refilled HCTZ 25 mg daily.  - hydrochlorothiazide (HYDRODIURIL) 25 MG tablet; Take 1 tablet (25 mg total) by mouth daily.  Dispense: 90 tablet; Refill: 1  3. Hypothyroidism, unspecified type: Stable, refilled levothyroxine 25 mcg.  - levothyroxine (SYNTHROID) 25 MCG tablet; Take 1 tablet (25 mcg total) by mouth daily before breakfast.  Dispense: 90 tablet; Refill: 1   -USPSTF grade A and B recommendations reviewed with patient; age-appropriate recommendations, preventive care, screening tests, etc discussed and encouraged; healthy living encouraged; see AVS for patient education given to patient -Discussed importance of 150 minutes of physical activity weekly, eat two servings of fish weekly, eat one serving of tree nuts ( cashews, pistachios, pecans, almonds.Marland Kitchen) every other day, eat 6 servings of fruit/vegetables daily and drink plenty of water and avoid sweet beverages.   -Reviewed Health Maintenance: Yes.

## 2022-12-31 ENCOUNTER — Ambulatory Visit (INDEPENDENT_AMBULATORY_CARE_PROVIDER_SITE_OTHER): Payer: BC Managed Care – PPO | Admitting: Internal Medicine

## 2022-12-31 VITALS — BP 128/82 | HR 88 | Temp 97.8°F | Ht 61.0 in | Wt 191.9 lb

## 2022-12-31 DIAGNOSIS — I10 Essential (primary) hypertension: Secondary | ICD-10-CM

## 2022-12-31 DIAGNOSIS — E039 Hypothyroidism, unspecified: Secondary | ICD-10-CM

## 2022-12-31 DIAGNOSIS — Z0000 Encounter for general adult medical examination without abnormal findings: Secondary | ICD-10-CM | POA: Diagnosis not present

## 2022-12-31 MED ORDER — LEVOTHYROXINE SODIUM 25 MCG PO TABS
25.0000 ug | ORAL_TABLET | Freq: Every day | ORAL | 1 refills | Status: DC
Start: 2022-12-31 — End: 2023-07-12

## 2022-12-31 MED ORDER — HYDROCHLOROTHIAZIDE 25 MG PO TABS: 25.0000 mg | ORAL_TABLET | Freq: Every day | ORAL | 1 refills | Status: DC

## 2023-01-02 DIAGNOSIS — R7309 Other abnormal glucose: Secondary | ICD-10-CM | POA: Diagnosis not present

## 2023-02-01 DIAGNOSIS — R7309 Other abnormal glucose: Secondary | ICD-10-CM | POA: Diagnosis not present

## 2023-02-24 ENCOUNTER — Other Ambulatory Visit: Payer: Self-pay

## 2023-02-24 ENCOUNTER — Encounter: Payer: Self-pay | Admitting: Adult Health

## 2023-02-24 ENCOUNTER — Ambulatory Visit (INDEPENDENT_AMBULATORY_CARE_PROVIDER_SITE_OTHER): Payer: Self-pay | Admitting: Adult Health

## 2023-02-24 VITALS — BP 116/80 | HR 72 | Temp 97.8°F | Ht 61.0 in | Wt 190.0 lb

## 2023-02-24 DIAGNOSIS — J3489 Other specified disorders of nose and nasal sinuses: Secondary | ICD-10-CM

## 2023-02-24 MED ORDER — PREDNISONE 10 MG PO TABS
ORAL_TABLET | ORAL | 0 refills | Status: AC
Start: 2023-02-24 — End: 2023-03-01

## 2023-02-24 NOTE — Progress Notes (Signed)
[facility] [address] [place], [place] [address]   Office Visit Note  Patient Name: Latasha Lopez Date of Birth [id number]  Medical Record number [id number]  Date of Service: 02/24/2023  Chief Complaint  Patient presents with   Acute Visit    Patient states she has been feeling "off balance" occasionally for the last week. She also feels like her ears may have fluid in them but denies pain/discomfort.      HPI Pt is here for a sick visit. She reports for the last week or so she has been feeling some dizziness when standing. She describes the episodes as brief.  She does report walking down the steps can be difficult with her bifocals, which is not normal.  She is taking her allergy medications,but has some sinus pressure.  Denies sore throat, cough, fever, chills or headache.     Current Medication:  Outpatient Encounter Medications as of 02/24/2023  Medication Sig   cholecalciferol (VITAMIN D) 1000 units tablet Take 1,000 Units by mouth. Take occasionally   cyanocobalamin 50 MCG tablet Take by mouth. Take occasionally   fexofenadine (ALLEGRA) 60 MG tablet Take 60 mg by mouth daily. Take additional tablets daily on occasion   fluticasone (FLONASE) 50 MCG/ACT nasal spray SPRAY 2 SPRAYS INTO EACH NOSTRIL EVERY DAY (Patient taking differently: 2 (two) times daily as needed.)   hydrochlorothiazide (HYDRODIURIL) 25 MG tablet Take 1 tablet (25 mg total) by mouth daily.   hydrOXYzine (ATARAX) 25 MG tablet Take 25 mg by mouth every 6 (six) hours as needed.   ibuprofen (ADVIL) 800 MG tablet Take 1 tablet (800 mg total) by mouth every 8 (eight) hours as needed. Take with food   levothyroxine (SYNTHROID) 25 MCG tablet Take 1 tablet (25 mcg total) by mouth daily before breakfast.   predniSONE (DELTASONE) 10 MG tablet Take 6 tablets (60 mg total) by mouth daily with breakfast for 1 day, THEN 5 tablets (50 mg total) daily with breakfast for 1 day, THEN 4 tablets (40 mg total) daily  with breakfast for 1 day, THEN 3 tablets (30 mg total) daily with breakfast for 1 day, THEN 2 tablets (20 mg total) daily with breakfast for 1 day, THEN 1 tablet (10 mg total) daily with breakfast for 1 day.   triamcinolone cream (KENALOG) 0.1 % Apply 1 Application topically. 2-3 times daily   omega-3 acid ethyl esters (LOVAZA) 1 g capsule Take by mouth 2 (two) times daily. (Patient not taking: Reported on 02/24/2023)   No facility-administered encounter medications on file as of 02/24/2023.      Medical History: Past Medical History:  Diagnosis Date   Allergy    History of chicken pox    Hypertension    Pre-diabetes    Thyroid disease      Vital Signs: BP 116/80   Pulse 72   Temp 97.8 F (36.6 C)   Ht 5\' 1"  (1.549 m)   Wt 190 lb (86.2 kg)   SpO2 97%   BMI 35.90 kg/m    Review of Systems  Constitutional:  Negative for chills, fatigue and fever.  HENT:  Positive for ear pain and sinus pressure. Negative for sore throat.   Eyes:  Negative for pain and itching.  Respiratory:  Negative for cough.   Cardiovascular:  Negative for chest pain.  Gastrointestinal:  Negative for diarrhea, nausea and vomiting.    Physical Exam Vitals reviewed.  Constitutional:      Appearance: Normal appearance.  HENT:     Head: Normocephalic.     Right Ear: Tympanic membrane and ear canal normal.     Left Ear: Tympanic membrane and ear canal normal.     Nose: Nose normal.     Mouth/Throat:     Mouth: Mucous membranes are moist.  Eyes:     Pupils: Pupils are equal, round, and reactive to light.  Pulmonary:     Effort: Pulmonary effort is normal.  Lymphadenopathy:     Cervical: No cervical adenopathy.  Neurological:     Mental Status: She is alert.     Assessment/Plan: 1. Sinus pressure Watch and wait to use prednisone if allergy medications do not help symptoms.  Pt can not take sudafed, due to BP and Heart rate reactions.  - predniSONE (DELTASONE) 10 MG tablet; Take 6 tablets  (60 mg total) by mouth daily with breakfast for 1 day, THEN 5 tablets (50 mg total) daily with breakfast for 1 day, THEN 4 tablets (40 mg total) daily with breakfast for 1 day, THEN 3 tablets (30 mg total) daily with breakfast for 1 day, THEN 2 tablets (20 mg total) daily with breakfast for 1 day, THEN 1 tablet (10 mg total) daily with breakfast for 1 day.  Dispense: 21 tablet; Refill: 0     General Counseling: Katelee verbalizes understanding of the findings of todays visit and agrees with plan of treatment. I have discussed any further diagnostic evaluation that may be needed or ordered today. We also reviewed her medications today. she has been encouraged to call the office with any questions or concerns that should arise related to todays visit.   No orders of the defined types were placed in this encounter.   Meds ordered this encounter  Medications   predniSONE (DELTASONE) 10 MG tablet    Sig: Take 6 tablets (60 mg total) by mouth daily with breakfast for 1 day, THEN 5 tablets (50 mg total) daily with breakfast for 1 day, THEN 4 tablets (40 mg total) daily with breakfast for 1 day, THEN 3 tablets (30 mg total) daily with breakfast for 1 day, THEN 2 tablets (20 mg total) daily with breakfast for 1 day, THEN 1 tablet (10 mg total) daily with breakfast for 1 day.    Dispense:  21 tablet    Refill:  0    Time spent:15 Minutes    Johnna Acosta AGNP-C Nurse Practitioner

## 2023-03-04 DIAGNOSIS — R7309 Other abnormal glucose: Secondary | ICD-10-CM | POA: Diagnosis not present

## 2023-04-03 DIAGNOSIS — R7309 Other abnormal glucose: Secondary | ICD-10-CM | POA: Diagnosis not present

## 2023-04-24 ENCOUNTER — Other Ambulatory Visit: Payer: Self-pay | Admitting: Adult Health

## 2023-04-24 DIAGNOSIS — J3489 Other specified disorders of nose and nasal sinuses: Secondary | ICD-10-CM

## 2023-05-04 DIAGNOSIS — R7309 Other abnormal glucose: Secondary | ICD-10-CM | POA: Diagnosis not present

## 2023-05-27 IMAGING — US US BREAST*L* LIMITED INC AXILLA
1 series · 7 of 7 positions shown · non-contrast
Comparison: Previous exam(s).

CLINICAL DATA: Patient returns after screening study for evaluation
of possible LEFT breast asymmetry.

EXAM:
DIGITAL DIAGNOSTIC UNILATERAL LEFT MAMMOGRAM WITH TOMOSYNTHESIS AND
CAD; ULTRASOUND LEFT BREAST LIMITED
TECHNIQUE: Left digital diagnostic mammography and breast tomosynthesis was
performed. The images were evaluated with computer-aided detection.;
Targeted ultrasound examination of the left breast was performed

[Series 1: us breast*left* limited inc axilla · 0.05mm/px · 7 of 7 slices shown]
[im 1/7]
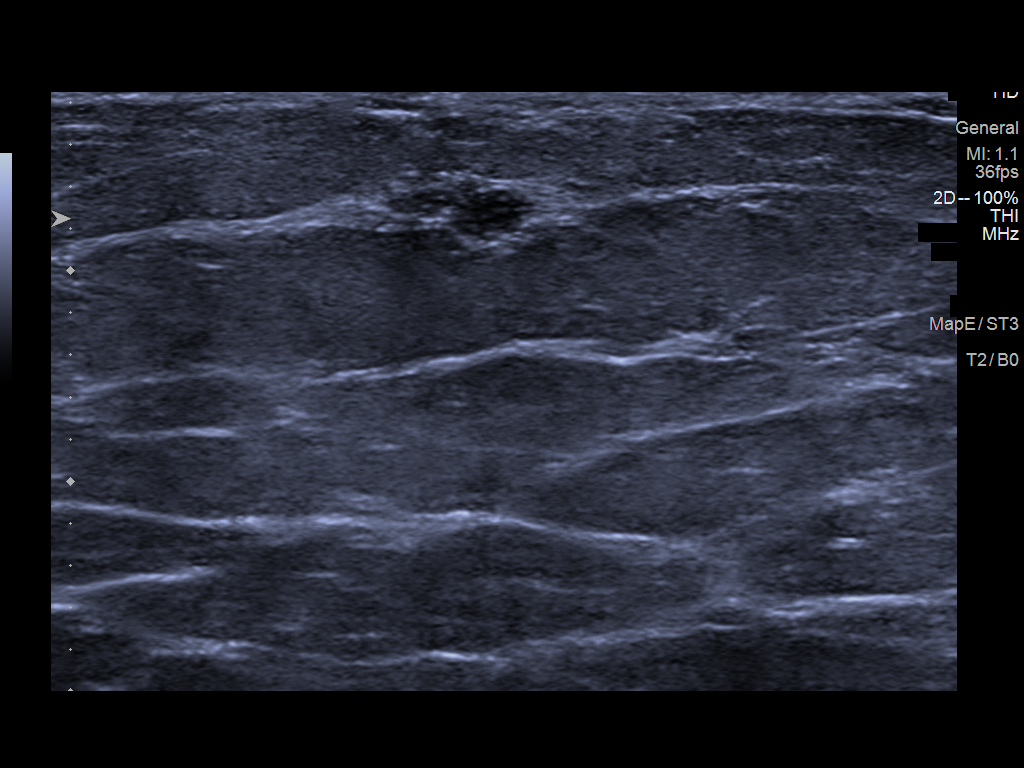
[im 2/7]
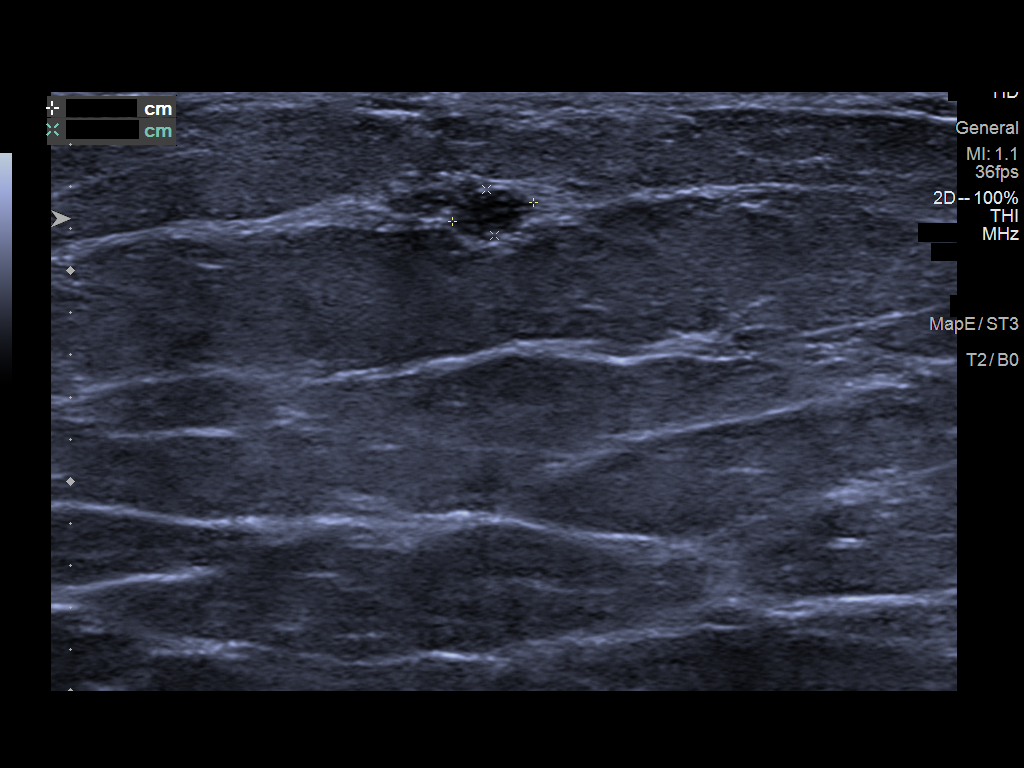
[im 3/7]
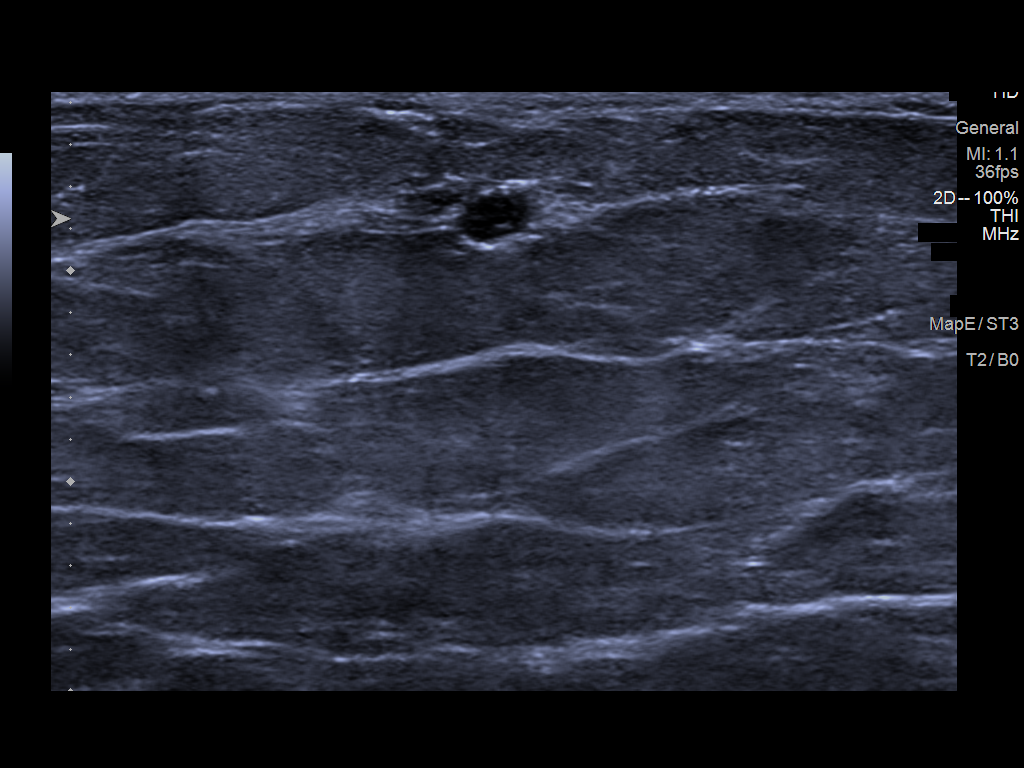
[im 4/7]
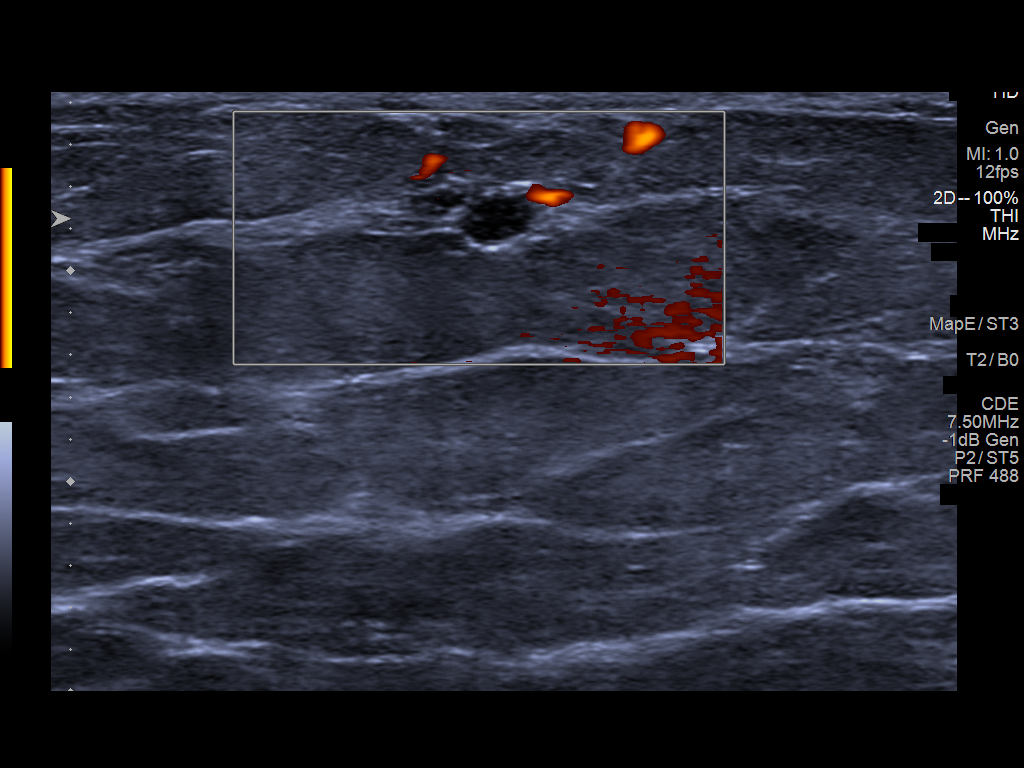
[im 5/7]
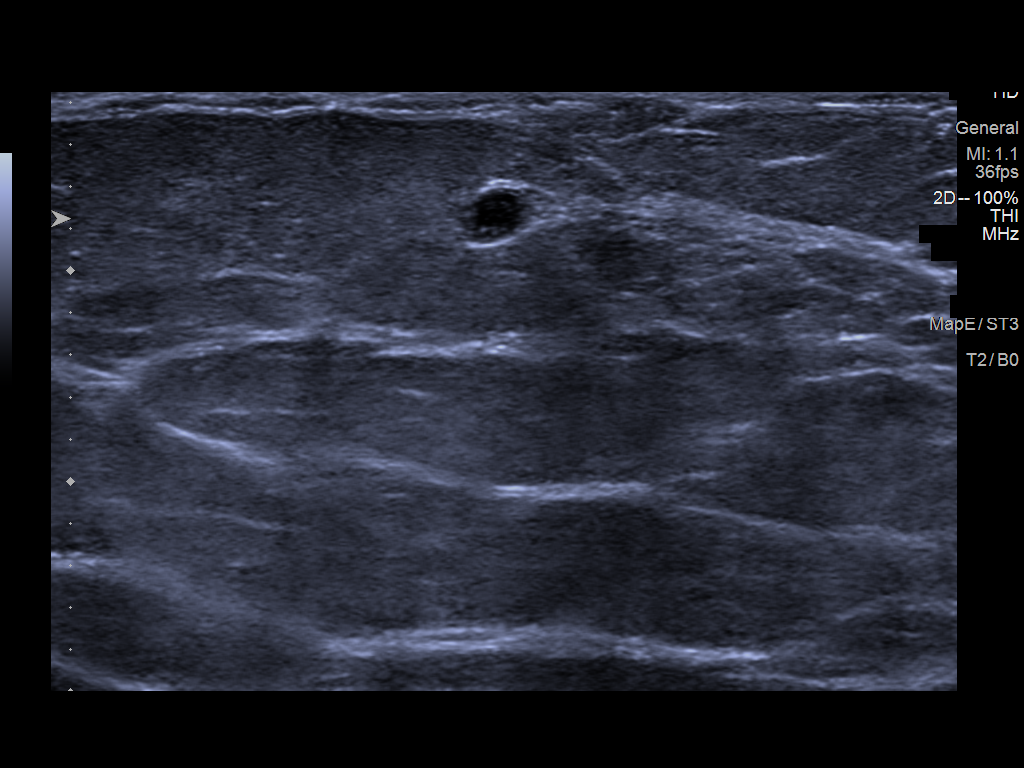
[im 6/7]
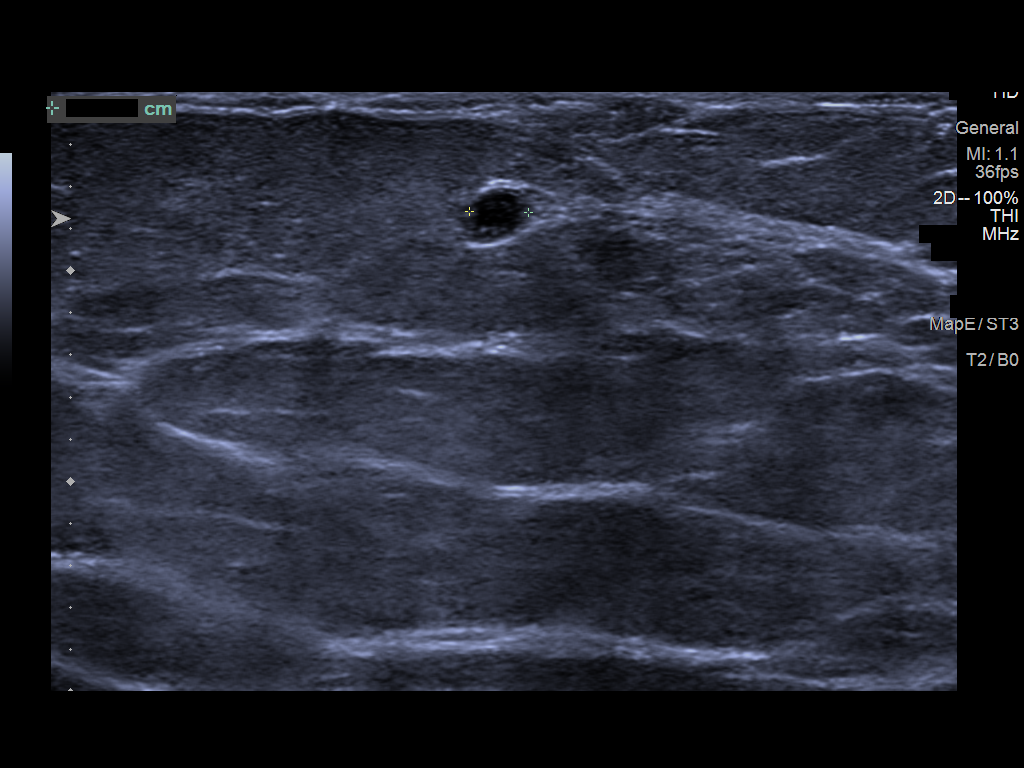
[im 7/7]
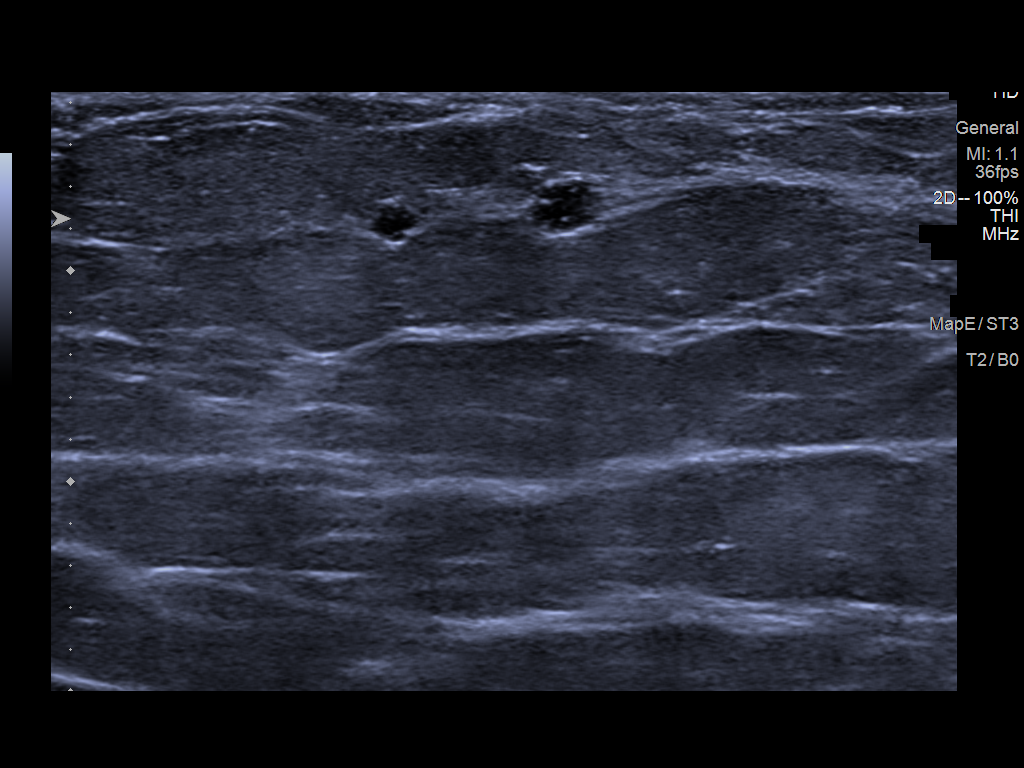

[7 of 7 positions shown; findings below may reference images not displayed]

ACR Breast Density Category b: There are scattered areas of
fibroglandular density.
FINDINGS: Additional 2-D and 3-D images are performed. These views confirm
presence of an oval circumscribed mass in the UPPER portion of the
LEFT breast.

Targeted ultrasound is performed, showing a circumscribed hypoechoic
oval mass with posterior acoustic enhancement in the 12 o'clock
location of the LEFT breast 4 centimeters from the nipple which
measures 0.4 x 0.2 x 0.3 centimeters. An adjacent similar appearing
mass is 2 millimeters in diameter. No internal blood flow identified
on Doppler evaluation.
IMPRESSION: Minimally complicated cyst in the 12 o'clock location of the LEFT
breast.

RECOMMENDATION:
Recommend followup LEFT breast ultrasound in 6 months to assess
stability.

I have discussed the findings and recommendations with the patient.
If applicable, a reminder letter will be sent to the patient
regarding the next appointment.

BI-RADS CATEGORY  3: Probably benign.

## 2023-06-04 DIAGNOSIS — R7309 Other abnormal glucose: Secondary | ICD-10-CM | POA: Diagnosis not present

## 2023-06-09 DIAGNOSIS — D2339 Other benign neoplasm of skin of other parts of face: Secondary | ICD-10-CM | POA: Diagnosis not present

## 2023-06-09 DIAGNOSIS — L2089 Other atopic dermatitis: Secondary | ICD-10-CM | POA: Diagnosis not present

## 2023-07-01 ENCOUNTER — Ambulatory Visit: Payer: BC Managed Care – PPO | Admitting: Internal Medicine

## 2023-07-01 VITALS — BP 122/80 | HR 93 | Temp 98.2°F | Resp 18 | Ht 73.0 in | Wt 195.2 lb

## 2023-07-01 DIAGNOSIS — R7303 Prediabetes: Secondary | ICD-10-CM | POA: Diagnosis not present

## 2023-07-01 DIAGNOSIS — E782 Mixed hyperlipidemia: Secondary | ICD-10-CM

## 2023-07-01 DIAGNOSIS — I10 Essential (primary) hypertension: Secondary | ICD-10-CM | POA: Diagnosis not present

## 2023-07-01 DIAGNOSIS — E559 Vitamin D deficiency, unspecified: Secondary | ICD-10-CM | POA: Insufficient documentation

## 2023-07-01 DIAGNOSIS — E039 Hypothyroidism, unspecified: Secondary | ICD-10-CM

## 2023-07-01 MED ORDER — HYDROCHLOROTHIAZIDE 25 MG PO TABS: 25.0000 mg | ORAL_TABLET | Freq: Every day | ORAL | 1 refills | Status: DC

## 2023-07-01 NOTE — Assessment & Plan Note (Signed)
 Recheck A1c

## 2023-07-01 NOTE — Assessment & Plan Note (Signed)
 Blood pressure stable here today, no changes made to medications and appropriate refills sent to pharmacy. Labs due.

## 2023-07-01 NOTE — Assessment & Plan Note (Signed)
 Stable, she will return for fasting labs.  Continue Lovaza.

## 2023-07-01 NOTE — Assessment & Plan Note (Signed)
 Recheck vitamin D levels.  Is currently on supplements.

## 2023-07-01 NOTE — Progress Notes (Signed)
 Established Patient Office Visit  Subjective    Patient ID: Latasha Lopez, female    DOB: [birth year]  Age: 63 y.o. MRN: [id number]  CC:  Chief Complaint  Patient presents with   Medical Management of Chronic Issues    HPI Latasha Lopez presents to follow up on chronic medical conditions.  Overall she is doing well and reports no changes.   Hypertension: -Medications: HCTZ 25 mg - stable on this medication for years -Patient is compliant with above medications and reports no side effects. -Denies any SOB, CP, vision changes, LE edema or symptoms of hypotension  Hypothyroidism: -Medications: Levothyroxine 25 mcg - taking about 5 times a week - uncertain if she still needs it  -Patient is generally compliant with the above medication (s) at the above dose and reports no medication side effects.  -Denies weight changes, cold./heat intolerance, skin changes, anxiety/palpitations  -Last TSH: 06/04/22 4.060  Pre-Diabetes: -Last A1c 7/24 6.2% -Not currently on medications  HLD: -Medications: Lovaza 1 g BID OTC -Patient is compliant with above medications and reports no side effects.  -Last lipid panel: Lipid Panel     Component Value Date/Time   CHOL 161 06/04/2022 0840   TRIG 124 06/04/2022 0840   HDL 47 06/04/2022 0840   CHOLHDL 3.4 06/04/2022 0840   LDLCALC 92 06/04/2022 0840   LABVLDL 22 06/04/2022 0840   Vitamin D Deficiency: -Currently on Vitamin D supplements 1000 IU -Vitamin D 2/24 32.1  Health Maintenance: -Blood work due -Mammogram 7/24 Birads-1 -Colon cancer screening: colonoscopy 05/2017, repeat in 10 years  Outpatient Encounter Medications as of 07/01/2023  Medication Sig   cholecalciferol (VITAMIN D) 1000 units tablet Take 1,000 Units by mouth. Take occasionally   cyanocobalamin 50 MCG tablet Take by mouth. Take occasionally   fexofenadine (ALLEGRA) 60 MG tablet Take 60 mg by mouth daily. Take additional tablets daily on occasion   fluticasone  (FLONASE) 50 MCG/ACT nasal spray SPRAY 2 SPRAYS INTO EACH NOSTRIL EVERY DAY (Patient taking differently: 2 (two) times daily as needed.)   hydrochlorothiazide (HYDRODIURIL) 25 MG tablet Take 1 tablet (25 mg total) by mouth daily.   hydrOXYzine (ATARAX) 25 MG tablet Take 25 mg by mouth every 6 (six) hours as needed.   ibuprofen (ADVIL) 800 MG tablet Take 1 tablet (800 mg total) by mouth every 8 (eight) hours as needed. Take with food   levothyroxine (SYNTHROID) 25 MCG tablet Take 1 tablet (25 mcg total) by mouth daily before breakfast.   triamcinolone cream (KENALOG) 0.1 % Apply 1 Application topically. 2-3 times daily   [DISCONTINUED] omega-3 acid ethyl esters (LOVAZA) 1 g capsule Take by mouth 2 (two) times daily. (Patient not taking: Reported on 02/24/2023)   No facility-administered encounter medications on file as of 07/01/2023.    Past Medical History:  Diagnosis Date   Allergy    History of chicken pox    Hypertension    Pre-diabetes    Thyroid disease     Past Surgical History:  Procedure Laterality Date   BREAST BIOPSY Left [phone]   neg   CESAREAN SECTION     TOTAL ABDOMINAL HYSTERECTOMY  05/04/2003    Family History  Problem Relation Age of Onset   Breast cancer Mother 73   Hypertension Mother    Fibroids Mother    Hypertension Father    Fibroids Sister    Diabetes Paternal Aunt     Social History   Socioeconomic History   Marital status: Married  Spouse name: Not on file   Number of children: Not on file   Years of education: Not on file   Highest education level: Bachelor's degree (e.g., BA, AB, BS)  Occupational History   Not on file  Tobacco Use   Smoking status: Never   Smokeless tobacco: Never  Vaping Use   Vaping status: Never Used  Substance and Sexual Activity   Alcohol use: No    Alcohol/week: 0.0 standard drinks of alcohol   Drug use: No   Sexual activity: Yes    Partners: Male    Birth control/protection: Surgical    Comment: Husband    Other Topics Concern   Not on file  Social History Narrative   Married    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[employer]    2 Children    39 yo daughter and 52 yo son    Multivitamin, Fish Oil, Vitamin D    Caffeine- diet coke, tea    Social Drivers of Health   Financial Resource Strain: Low Risk  (07/01/2023)   Overall Financial Resource Strain (CARDIA)    Difficulty of Paying Living Expenses: Not hard at all  Food Insecurity: No Food Insecurity (07/01/2023)   Hunger Vital Sign    Worried About Running Out of Food in the Last Year: Never true    Ran Out of Food in the Last Year: Never true  Transportation Needs: No Transportation Needs (07/01/2023)   PRAPARE - Transportation    Lack of Transportation (Medical): No    Lack of Transportation (Non-Medical): No  Physical Activity: Inactive (07/01/2023)   Exercise Vital Sign    Days of Exercise per Week: 0 days    Minutes of Exercise per Session: 10 min  Stress: No Stress Concern Present (07/01/2023)   [employer] of Occupational Health - Occupational Stress Questionnaire    Feeling of Stress : Only a little  Social Connections: Socially Integrated (07/01/2023)   Social Connection and Isolation Panel [NHANES]    Frequency of Communication with Friends and Family: Three times a week    Frequency of Social Gatherings with Friends and Family: More than three times a week    Attends Religious Services: 1 to 4 times per year    Active Member of [employer] or Organizations: Yes    Attends Banker Meetings: 1 to 4 times per year    Marital Status: Married  Catering manager Violence: Not on file    Review of Systems  Constitutional:  Negative for chills and fever.  Eyes:  Negative for blurred vision.  Respiratory:  Negative for shortness of breath.   Cardiovascular:  Negative for chest pain.  Neurological:  Negative for headaches.        Objective    BP 122/80   Pulse 93   Temp 98.2 F (36.8 C)   Resp 18   Ht 6\' 1"   (1.854 m)   Wt 195 lb 3.2 oz (88.5 kg)   SpO2 97%   BMI 25.75 kg/m   Physical Exam Constitutional:      Appearance: Normal appearance.  HENT:     Head: Normocephalic and atraumatic.     Mouth/Throat:     Mouth: Mucous membranes are moist.     Pharynx: Oropharynx is clear.  Eyes:     Extraocular Movements: Extraocular movements intact.     Conjunctiva/sclera: Conjunctivae normal.     Pupils: Pupils are equal, round, and reactive to light.  Neck:  Comments: No thyromegaly Cardiovascular:     Rate and Rhythm: Normal rate and regular rhythm.  Pulmonary:     Effort: Pulmonary effort is normal.     Breath sounds: Normal breath sounds.  Musculoskeletal:     Cervical back: No tenderness.     Right lower leg: No edema.     Left lower leg: No edema.  Lymphadenopathy:     Cervical: No cervical adenopathy.  Skin:    General: Skin is warm and dry.  Neurological:     General: No focal deficit present.     Mental Status: She is alert. Mental status is at baseline.  Psychiatric:        Mood and Affect: Mood normal.        Behavior: Behavior normal.         Assessment & Plan:   Essential hypertension Assessment & Plan: Blood pressure stable here today, no changes made to medications and appropriate refills sent to pharmacy.  Labs due.  Orders: -     CBC with Differential/Platelet -     COMPLETE METABOLIC PANEL WITH GFR -     hydroCHLOROthiazide; Take 1 tablet (25 mg total) by mouth daily.  Dispense: 90 tablet; Refill: 1  Prediabetes Assessment & Plan: Recheck A1c.  Orders: -     Hemoglobin A1c  Mixed hyperlipidemia Assessment & Plan: Stable, she will return for fasting labs.  Continue Lovaza.  Orders: -     Lipid panel  Vitamin D deficiency Assessment & Plan: Recheck vitamin D levels.  Is currently on supplements.  Orders: -     VITAMIN D 25 Hydroxy (Vit-D Deficiency, Fractures)  Hypothyroidism, unspecified type Assessment & Plan: Patient states she  has been on levothyroxine 25 mcg daily for several years but is uncertain if she still needs it.  She only takes it about 5 times a week.  Will check TSH today to make sure it is not too low and if it is in the normal range we can either continue current dose or discontinue it and recheck labs.  Orders: -     TSH    Return in about 6 months (around 12/29/2023) for physical.   Margarita Mail, DO

## 2023-07-01 NOTE — Assessment & Plan Note (Signed)
 Patient states she has been on levothyroxine 25 mcg daily for several years but is uncertain if she still needs it.  She only takes it about 5 times a week.  Will check TSH today to make sure it is not too low and if it is in the normal range we can either continue current dose or discontinue it and recheck labs.

## 2023-07-02 DIAGNOSIS — R7309 Other abnormal glucose: Secondary | ICD-10-CM | POA: Diagnosis not present

## 2023-07-06 DIAGNOSIS — R7303 Prediabetes: Secondary | ICD-10-CM | POA: Diagnosis not present

## 2023-07-06 DIAGNOSIS — E782 Mixed hyperlipidemia: Secondary | ICD-10-CM | POA: Diagnosis not present

## 2023-07-06 DIAGNOSIS — E559 Vitamin D deficiency, unspecified: Secondary | ICD-10-CM | POA: Diagnosis not present

## 2023-07-06 DIAGNOSIS — E039 Hypothyroidism, unspecified: Secondary | ICD-10-CM | POA: Diagnosis not present

## 2023-07-06 DIAGNOSIS — I10 Essential (primary) hypertension: Secondary | ICD-10-CM | POA: Diagnosis not present

## 2023-07-07 ENCOUNTER — Other Ambulatory Visit: Payer: Self-pay | Admitting: Internal Medicine

## 2023-07-07 DIAGNOSIS — R7303 Prediabetes: Secondary | ICD-10-CM

## 2023-07-07 LAB — COMPLETE METABOLIC PANEL WITH GFR
AG Ratio: 1.9 (calc) (ref 1.0–2.5)
ALT: 28 U/L (ref 6–29)
AST: 22 U/L (ref 10–35)
Albumin: 4.4 g/dL (ref 3.6–5.1)
Alkaline phosphatase (APISO): 82 U/L (ref 37–153)
BUN: 14 mg/dL (ref 7–25)
CO2: 33 mmol/L — ABNORMAL HIGH (ref 20–32)
Calcium: 9.5 mg/dL (ref 8.6–10.4)
Chloride: 100 mmol/L (ref 98–110)
Creat: 0.7 mg/dL (ref 0.50–1.05)
Globulin: 2.3 g/dL (ref 1.9–3.7)
Glucose, Bld: 144 mg/dL — ABNORMAL HIGH (ref 65–99)
Potassium: 4.1 mmol/L (ref 3.5–5.3)
Sodium: 140 mmol/L (ref 135–146)
Total Bilirubin: 0.6 mg/dL (ref 0.2–1.2)
Total Protein: 6.7 g/dL (ref 6.1–8.1)
eGFR: 97 mL/min/{1.73_m2} (ref >=60)

## 2023-07-07 LAB — LIPID PANEL
Cholesterol: 164 mg/dL (ref <200)
HDL: 48 mg/dL — ABNORMAL LOW (ref >=50)
LDL Cholesterol (Calc): 90 mg/dL
Non-HDL Cholesterol (Calc): 116 mg/dL (ref <130)
Total CHOL/HDL Ratio: 3.4 (calc) (ref <5.0)
Triglycerides: 163 mg/dL — ABNORMAL HIGH (ref <150)

## 2023-07-07 LAB — CBC WITH DIFFERENTIAL/PLATELET
Absolute Lymphocytes: 2150 {cells}/uL (ref 850–3900)
Absolute Monocytes: 506 {cells}/uL (ref 200–950)
Basophils Absolute: 70 {cells}/uL (ref 0–200)
Basophils Relative: 1.1 %
Eosinophils Absolute: 13 {cells}/uL — ABNORMAL LOW (ref 15–500)
Eosinophils Relative: 0.2 %
HCT: 43.1 % (ref 35.0–45.0)
Hemoglobin: 14.6 g/dL (ref 11.7–15.5)
MCH: 28.4 pg (ref 27.0–33.0)
MCHC: 33.9 g/dL (ref 32.0–36.0)
MCV: 83.9 fL (ref 80.0–100.0)
MPV: 10.7 fL (ref 7.5–12.5)
Monocytes Relative: 7.9 %
Neutro Abs: 3661 {cells}/uL (ref 1500–7800)
Neutrophils Relative %: 57.2 %
Platelets: 288 10*3/uL (ref 140–400)
RBC: 5.14 10*6/uL — ABNORMAL HIGH (ref 3.80–5.10)
RDW: 12.9 % (ref 11.0–15.0)
Total Lymphocyte: 33.6 %
WBC: 6.4 10*3/uL (ref 3.8–10.8)

## 2023-07-07 LAB — HEMOGLOBIN A1C
Hgb A1c MFr Bld: 6.4 %{Hb} — ABNORMAL HIGH (ref <5.7)
Mean Plasma Glucose: 137 mg/dL
eAG (mmol/L): 7.6 mmol/L

## 2023-07-07 LAB — VITAMIN D 25 HYDROXY (VIT D DEFICIENCY, FRACTURES): Vit D, 25-Hydroxy: 38 ng/mL (ref 30–100)

## 2023-07-07 LAB — TSH: TSH: 3.52 m[IU]/L (ref 0.40–4.50)

## 2023-07-07 MED ORDER — METFORMIN HCL 500 MG PO TABS: 500.0000 mg | ORAL_TABLET | Freq: Two times a day (BID) | ORAL | 1 refills | Status: DC

## 2023-07-11 ENCOUNTER — Other Ambulatory Visit: Payer: Self-pay | Admitting: Internal Medicine

## 2023-07-11 DIAGNOSIS — E039 Hypothyroidism, unspecified: Secondary | ICD-10-CM

## 2023-07-12 NOTE — Telephone Encounter (Signed)
 Requested Prescriptions  Pending Prescriptions Disp Refills   levothyroxine (SYNTHROID) 25 MCG tablet [Pharmacy Med Name: LEVOTHYROXINE 25 MCG TABLET] 90 tablet 1    Sig: TAKE 1 TABLET BY MOUTH DAILY BEFORE BREAKFAST.     Endocrinology:  Hypothyroid Agents Passed - 07/12/2023 10:40 AM      Passed - TSH in normal range and within 360 days    TSH  Date Value Ref Range Status  07/06/2023 3.52 0.40 - 4.50 mIU/L Final         Passed - Valid encounter within last 12 months    Recent Outpatient Visits           6 months ago Annual physical exam   [facility] Margarita Mail, DO   1 year ago Primary hypertension   [facility] Margarita Mail, DO       Future Appointments             In 6 months Margarita Mail, DO [facility]

## 2023-08-02 DIAGNOSIS — R7309 Other abnormal glucose: Secondary | ICD-10-CM | POA: Diagnosis not present

## 2023-09-01 DIAGNOSIS — R7309 Other abnormal glucose: Secondary | ICD-10-CM | POA: Diagnosis not present

## 2023-10-02 ENCOUNTER — Other Ambulatory Visit: Payer: Self-pay | Admitting: Internal Medicine

## 2023-10-02 DIAGNOSIS — R7309 Other abnormal glucose: Secondary | ICD-10-CM | POA: Diagnosis not present

## 2023-10-02 DIAGNOSIS — R7303 Prediabetes: Secondary | ICD-10-CM

## 2023-10-03 NOTE — Telephone Encounter (Signed)
 Requested Prescriptions  Pending Prescriptions Disp Refills   metFORMIN  (GLUCOPHAGE ) 500 MG tablet [Pharmacy Med Name: METFORMIN  HCL 500 MG TABLET] 180 tablet 0    Sig: TAKE 1 TABLET BY MOUTH 2 TIMES DAILY WITH A MEAL.     Endocrinology:  Diabetes - Biguanides Passed - 10/03/2023  3:52 PM      Passed - Cr in normal range and within 360 days    Creat  Date Value Ref Range Status  07/06/2023 0.70 0.50 - 1.05 mg/dL Final         Passed - HBA1C is between 0 and 7.9 and within 180 days    Hemoglobin A1C  Date Value Ref Range Status  08/15/2015 6.2  Final   Hgb A1c MFr Bld  Date Value Ref Range Status  07/06/2023 6.4 (H) <5.7 % of total Hgb Final    Comment:    For someone without known diabetes, a hemoglobin  A1c value between 5.7% and 6.4% is consistent with prediabetes and should be confirmed with a  follow-up test. . For someone with known diabetes, a value <7% indicates that their diabetes is well controlled. A1c targets should be individualized based on duration of diabetes, age, comorbid conditions, and other considerations. . This assay result is consistent with an increased risk of diabetes. . Currently, no consensus exists regarding use of hemoglobin A1c for diagnosis of diabetes for children. .          Passed - eGFR in normal range and within 360 days    GFR calc Af Amer  Date Value Ref Range Status  09/27/2019 93 >59 mL/min/1.73 Final    Comment:    **[facility] currently reports eGFR in compliance with the current**   recommendations of the [employer]. [facility] will   update reporting as new guidelines are published from the NKF-ASN   Task force.    GFR calc non Af Amer  Date Value Ref Range Status  09/27/2019 80 >59 mL/min/1.73 Final   eGFR  Date Value Ref Range Status  07/06/2023 97 > OR = 60 mL/min/1.65m2 Final  11/10/2022 85 >59 mL/min/1.73 Final         Passed - B12 Level in normal range and within 720 days    Vitamin B-12  Date  Value Ref Range Status  06/04/2022 953 232 - 1,245 pg/mL Final         Passed - Valid encounter within last 6 months    Recent Outpatient Visits           3 months ago Essential hypertension   [place] [facility] Rockney Cid, DO       Future Appointments             In 3 months Rockney Cid, DO [facility]            Passed - CBC within normal limits and completed in the last 12 months    WBC  Date Value Ref Range Status  07/06/2023 6.4 3.8 - 10.8 Thousand/uL Final   RBC  Date Value Ref Range Status  07/06/2023 5.14 (H) 3.80 - 5.10 Million/uL Final   Hemoglobin  Date Value Ref Range Status  07/06/2023 14.6 11.7 - 15.5 g/dL Final  16/02/9603 54.0 11.1 - 15.9 g/dL Final   HCT  Date Value Ref Range Status  07/06/2023 43.1 35.0 - 45.0 % Final   Hematocrit  Date Value Ref Range Status  06/04/2022 37.6 34.0 - 46.6 %  Final   MCHC  Date Value Ref Range Status  07/06/2023 33.9 32.0 - 36.0 g/dL Final    Comment:    For adults, a slight decrease in the calculated MCHC value (in the range of 30 to 32 g/dL) is most likely not clinically significant; however, it should be interpreted with caution in correlation with other red cell parameters and the patient's clinical condition.    [facility]  Date Value Ref Range Status  07/06/2023 28.4 27.0 - 33.0 pg Final   MCV  Date Value Ref Range Status  07/06/2023 83.9 80.0 - 100.0 fL Final  06/04/2022 80 79 - 97 fL Final   No results found for: "PLTCOUNTKUC", "LABPLAT", "POCPLA" RDW  Date Value Ref Range Status  07/06/2023 12.9 11.0 - 15.0 % Final  06/04/2022 12.9 11.7 - 15.4 % Final

## 2023-11-01 DIAGNOSIS — R7309 Other abnormal glucose: Secondary | ICD-10-CM | POA: Diagnosis not present

## 2023-11-25 IMAGING — US US BREAST*L* LIMITED INC AXILLA
1 series · 9 of 9 positions shown · non-contrast
Comparison: Previous exam(s).

CLINICAL DATA: 60-year-old female presenting for six-month
follow-up of a probably benign left breast mass.

EXAM:
ULTRASOUND OF THE LEFT BREAST

[Series 1: us breast*left* limited inc axilla · 0.05mm/px · 9 of 9 slices shown]
[im 1/9]
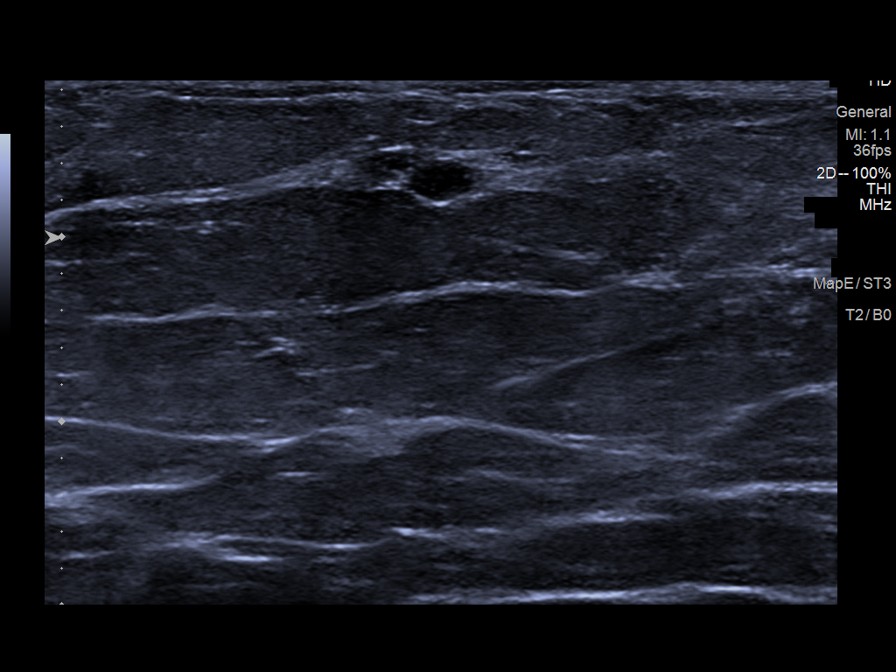
[im 2/9]
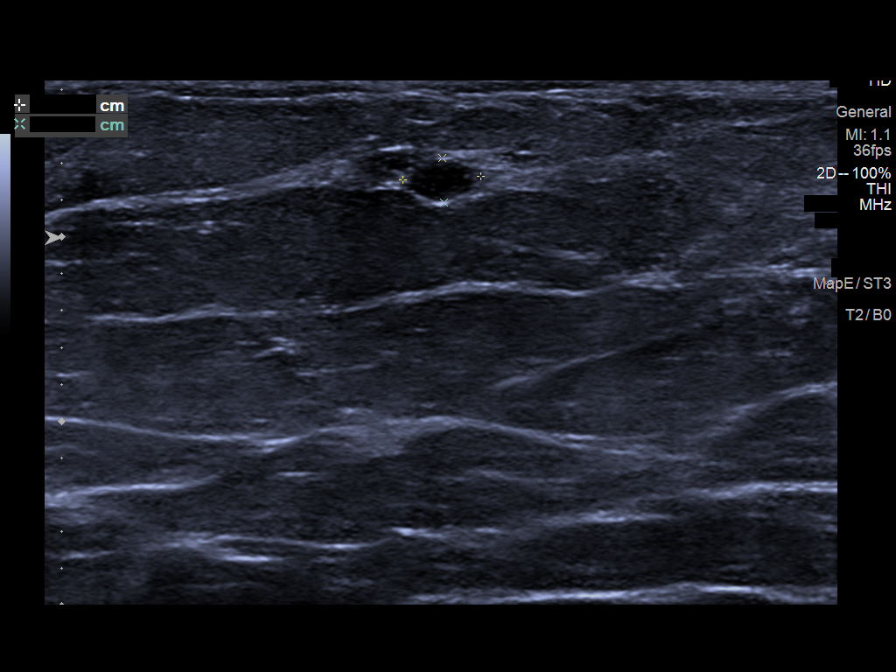
[im 3/9]
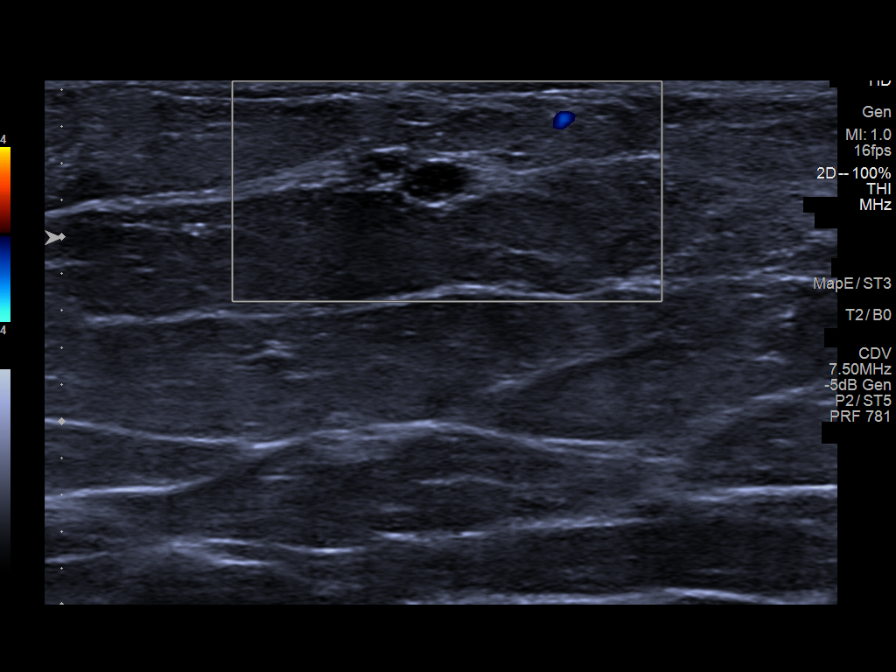
[im 4/9]
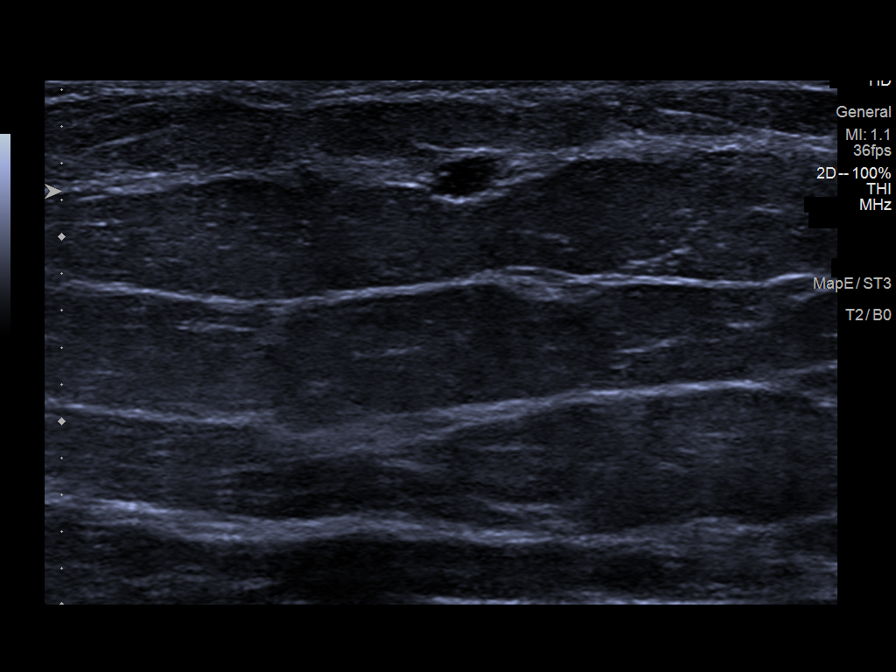
[im 5/9]
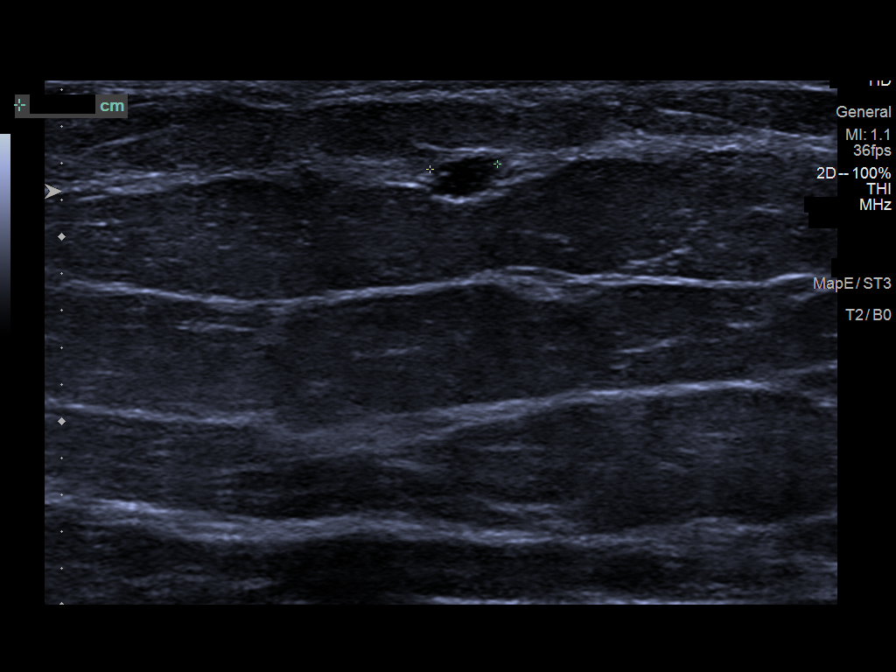
[im 6/9]
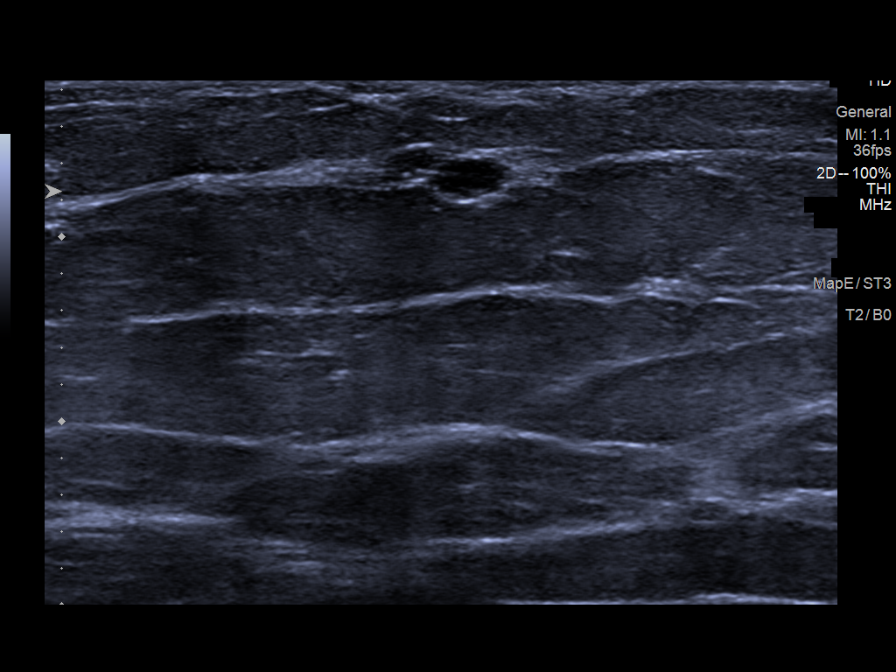
[im 7/9]
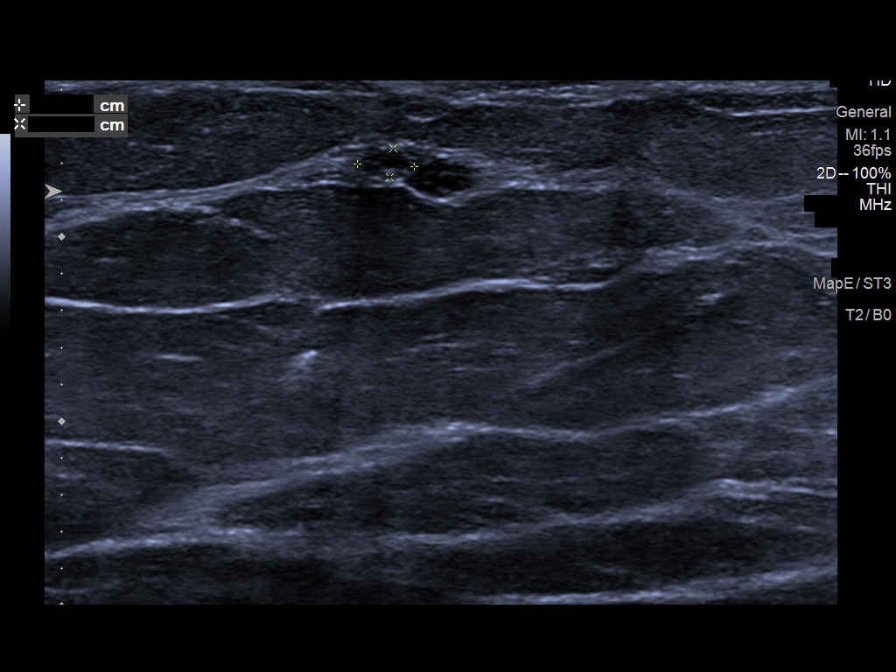
[im 8/9]
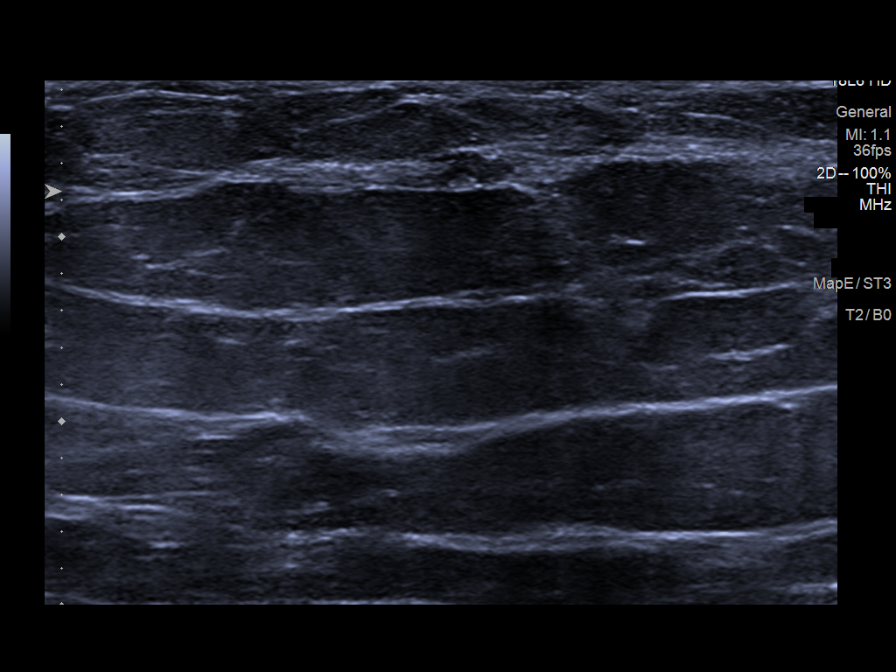
[im 9/9]
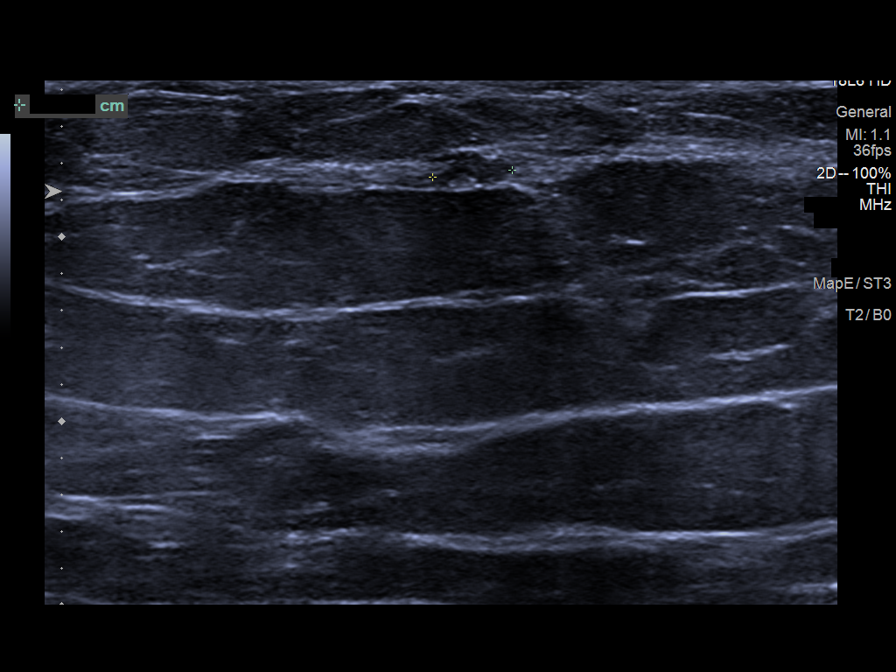

[9 of 9 positions shown; findings below may reference images not displayed]

FINDINGS: Targeted ultrasound is performed in the left breast at 12 o'clock 4
cm from the nipple demonstrating an oval circumscribed hypoechoic
mass measuring 0.4 x 0.2 x 0.4 cm, previously measuring 0.4 x 0.2 x
0.3 cm, most likely a complicated cyst. There is an adjacent smaller
cystic area which is also unchanged from prior measuring up to
cm.
IMPRESSION: Stable probably benign mass in the left breast 12 o'clock, likely a
complicated cyst.

RECOMMENDATION:
Diagnostic bilateral mammogram and left breast ultrasound in 6
months.

I have discussed the findings and recommendations with the patient.
If applicable, a reminder letter will be sent to the patient
regarding the next appointment.

BI-RADS CATEGORY  3: Probably benign.

## 2023-11-29 ENCOUNTER — Encounter: Payer: Self-pay | Admitting: Internal Medicine

## 2023-11-29 DIAGNOSIS — Z1231 Encounter for screening mammogram for malignant neoplasm of breast: Secondary | ICD-10-CM

## 2023-12-02 DIAGNOSIS — R7309 Other abnormal glucose: Secondary | ICD-10-CM | POA: Diagnosis not present

## 2023-12-29 DIAGNOSIS — D2261 Melanocytic nevi of right upper limb, including shoulder: Secondary | ICD-10-CM | POA: Diagnosis not present

## 2023-12-29 DIAGNOSIS — D2272 Melanocytic nevi of left lower limb, including hip: Secondary | ICD-10-CM | POA: Diagnosis not present

## 2023-12-29 DIAGNOSIS — D225 Melanocytic nevi of trunk: Secondary | ICD-10-CM | POA: Diagnosis not present

## 2023-12-29 DIAGNOSIS — D2262 Melanocytic nevi of left upper limb, including shoulder: Secondary | ICD-10-CM | POA: Diagnosis not present

## 2023-12-31 ENCOUNTER — Telehealth: Admitting: Physician Assistant

## 2023-12-31 DIAGNOSIS — R3989 Other symptoms and signs involving the genitourinary system: Secondary | ICD-10-CM

## 2023-12-31 MED ORDER — CEPHALEXIN 500 MG PO CAPS: 500.0000 mg | ORAL_CAPSULE | Freq: Two times a day (BID) | ORAL | 0 refills | Status: AC

## 2023-12-31 NOTE — Progress Notes (Signed)
 I have spent 5 minutes in review of e-visit questionnaire, review and updating patient chart, medical decision making and response to patient.   Elsie Velma Lunger, PA-C

## 2023-12-31 NOTE — Progress Notes (Signed)

## 2024-01-02 DIAGNOSIS — R7309 Other abnormal glucose: Secondary | ICD-10-CM | POA: Diagnosis not present

## 2024-01-07 ENCOUNTER — Other Ambulatory Visit: Payer: Self-pay | Admitting: Internal Medicine

## 2024-01-07 DIAGNOSIS — R7303 Prediabetes: Secondary | ICD-10-CM

## 2024-01-09 NOTE — Telephone Encounter (Signed)
 Requested Prescriptions  Pending Prescriptions Disp Refills   metFORMIN  (GLUCOPHAGE ) 500 MG tablet [Pharmacy Med Name: METFORMIN  HCL 500 MG TABLET] 180 tablet 0    Sig: TAKE 1 TABLET BY MOUTH 2 TIMES DAILY WITH A MEAL.     Endocrinology:  Diabetes - Biguanides Failed - 01/09/2024 10:52 AM      Failed - HBA1C is between 0 and 7.9 and within 180 days    Hemoglobin A1C  Date Value Ref Range Status  08/15/2015 6.2  Final   Hgb A1c MFr Bld  Date Value Ref Range Status  07/06/2023 6.4 (H) <5.7 % of total Hgb Final    Comment:    For someone without known diabetes, a hemoglobin  A1c value between 5.7% and 6.4% is consistent with prediabetes and should be confirmed with a  follow-up test. . For someone with known diabetes, a value <7% indicates that their diabetes is well controlled. A1c targets should be individualized based on duration of diabetes, age, comorbid conditions, and other considerations. . This assay result is consistent with an increased risk of diabetes. . Currently, no consensus exists regarding use of hemoglobin A1c for diagnosis of diabetes for children. .          Failed - Valid encounter within last 6 months    Recent Outpatient Visits           6 months ago Essential hypertension   [place] [facility] Bernardo Fend, DO       Future Appointments             In 1 week Bernardo Fend, DO [place] [facility] [place] - Cr in normal range and within 360 days    Creat  Date Value Ref Range Status  07/06/2023 0.70 0.50 - 1.05 mg/dL Final         Passed - eGFR in normal range and within 360 days    GFR calc Af Amer  Date Value Ref Range Status  09/27/2019 93 >59 mL/min/1.73 Final    Comment:    **[facility] currently reports eGFR in compliance with the current**   recommendations of the [employer]. [facility] will   update reporting as new guidelines are published from  the NKF-ASN   Task force.    GFR calc non Af Amer  Date Value Ref Range Status  09/27/2019 80 >59 mL/min/1.73 Final   eGFR  Date Value Ref Range Status  07/06/2023 97 > OR = 60 mL/min/1.77m2 Final  11/10/2022 85 >59 mL/min/1.73 Final         Passed - B12 Level in normal range and within 720 days    Vitamin B-12  Date Value Ref Range Status  06/04/2022 953 232 - 1,245 pg/mL Final         Passed - CBC within normal limits and completed in the last 12 months    WBC  Date Value Ref Range Status  07/06/2023 6.4 3.8 - 10.8 Thousand/uL Final   RBC  Date Value Ref Range Status  07/06/2023 5.14 (H) 3.80 - 5.10 Million/uL Final   Hemoglobin  Date Value Ref Range Status  07/06/2023 14.6 11.7 - 15.5 g/dL Final  97/97/7975 87.4 11.1 - 15.9 g/dL Final   HCT  Date Value Ref Range Status  07/06/2023 43.1 35.0 - 45.0 % Final   Hematocrit  Date Value Ref Range Status  06/04/2022 37.6 34.0 - 46.6 %  Final   MCHC  Date Value Ref Range Status  07/06/2023 33.9 32.0 - 36.0 g/dL Final    Comment:    For adults, a slight decrease in the calculated MCHC value (in the range of 30 to 32 g/dL) is most likely not clinically significant; however, it should be interpreted with caution in correlation with other red cell parameters and the patient's clinical condition.    [facility]  Date Value Ref Range Status  07/06/2023 28.4 27.0 - 33.0 pg Final   MCV  Date Value Ref Range Status  07/06/2023 83.9 80.0 - 100.0 fL Final  06/04/2022 80 79 - 97 fL Final   No results found for: PLTCOUNTKUC, LABPLAT, POCPLA RDW  Date Value Ref Range Status  07/06/2023 12.9 11.0 - 15.0 % Final  06/04/2022 12.9 11.7 - 15.4 % Final

## 2024-01-16 ENCOUNTER — Ambulatory Visit: Payer: BC Managed Care – PPO | Admitting: Internal Medicine

## 2024-01-16 ENCOUNTER — Encounter: Payer: Self-pay | Admitting: Internal Medicine

## 2024-01-16 ENCOUNTER — Other Ambulatory Visit: Payer: Self-pay

## 2024-01-16 VITALS — BP 132/78 | HR 92 | Temp 97.9°F | Resp 16 | Ht 61.0 in | Wt 189.3 lb

## 2024-01-16 DIAGNOSIS — Z23 Encounter for immunization: Secondary | ICD-10-CM | POA: Diagnosis not present

## 2024-01-16 DIAGNOSIS — R7303 Prediabetes: Secondary | ICD-10-CM

## 2024-01-16 DIAGNOSIS — I10 Essential (primary) hypertension: Secondary | ICD-10-CM | POA: Diagnosis not present

## 2024-01-16 DIAGNOSIS — Z789 Other specified health status: Secondary | ICD-10-CM | POA: Diagnosis not present

## 2024-01-16 DIAGNOSIS — E039 Hypothyroidism, unspecified: Secondary | ICD-10-CM

## 2024-01-16 MED ORDER — METFORMIN HCL 500 MG PO TABS: 500.0000 mg | ORAL_TABLET | Freq: Every day | ORAL | 0 refills | Status: DC

## 2024-01-16 NOTE — Progress Notes (Signed)
 Established Patient Office Visit  Subjective   Patient ID: Latasha Lopez, female    DOB: Aug 09, 1960  Age: 63 y.o. MRN: [id number]  Chief Complaint  Patient presents with   Follow-up    HPI  Patient is here for follow up on chronic medical conditions.   Discussed the use of AI scribe software for clinical note transcription with the patient, who gave verbal consent to proceed.  History of Present Illness Latasha Lopez is a 63 year old female who presents for a routine follow-up visit.  She discontinued levothyroxine  25 mcg due to lack of symptom improvement and is due for a thyroid level recheck today. She has not experienced weight gain, depression, constipation, or dry skin.  She started metformin  in March for prediabetes management. Initially, she experienced diarrhea but now tolerates it well, although she often forgets the evening dose. Her highest A1c was 6.4.  She has been on hydrochlorothiazide  for hypertension for over ten years without consistently high readings over 140/90. She has not experienced dizziness or lightheadedness.  A recent bacterial infection was treated with antibiotics, resolving her symptoms of pressure and discomfort.  Her family history includes her sister with stage 3 kidney disease and her father with kidney stones and stage 4 kidney disease. She has no known kidney issues.  She is considering a shingles vaccine in January and has a mammogram scheduled for October 1st. She had a colonoscopy in 2019, with a recommendation to repeat in five years due to her father's history of polyps.  She is concerned about her immunity to measles due to potential exposure at work and seeks confirmation of her vaccination status.   Patient Active Problem List   Diagnosis Date Noted   Mixed hyperlipidemia 07/01/2023   Vitamin D  deficiency 07/01/2023   Hypertriglyceridemia 09/21/2018   Hypothyroidism 09/21/2018   Essential hypertension 09/21/2018    Prediabetes 03/07/2018   H/O total hysterectomy 12/17/2016   Screening breast examination 08/31/2015   Routine general medical examination at a health care facility 08/31/2015   Post-menopausal atrophic vaginitis 05/28/2015   Encounter to establish care 05/26/2015   Past Medical History:  Diagnosis Date   Allergy    History of chicken pox    Hypertension    Pre-diabetes    Thyroid disease    Past Surgical History:  Procedure Laterality Date   BREAST BIOPSY Left [phone]   neg   CESAREAN SECTION     TOTAL ABDOMINAL HYSTERECTOMY  05/04/2003   Social History   Tobacco Use   Smoking status: Never   Smokeless tobacco: Never  Vaping Use   Vaping status: Never Used  Substance Use Topics   Alcohol use: No    Alcohol/week: 0.0 standard drinks of alcohol   Drug use: No   Social History   Socioeconomic History   Marital status: Married    Spouse name: Not on file   Number of children: Not on file   Years of education: Not on file   Highest education level: Bachelor's degree (e.g., BA, AB, BS)  Occupational History   Not on file  Tobacco Use   Smoking status: Never   Smokeless tobacco: Never  Vaping Use   Vaping status: Never Used  Substance and Sexual Activity   Alcohol use: No    Alcohol/week: 0.0 standard drinks of alcohol   Drug use: No   Sexual activity: Yes    Partners: Male    Birth control/protection: Surgical    Comment: Husband  Other Topics Concern   Not on file  Social History Narrative   Married    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[employer]    2 Children    4 yo daughter and 98 yo son    Multivitamin, Fish Oil, Vitamin D     Caffeine- diet coke, tea    Social Drivers of Corporate investment banker Strain: Low Risk  (01/13/2024)   Overall Financial Resource Strain (CARDIA)    Difficulty of Paying Living Expenses: Not hard at all  Food Insecurity: No Food Insecurity (01/13/2024)   Hunger Vital Sign    Worried About Running Out of Food in the Last  Year: Never true    Ran Out of Food in the Last Year: Never true  Transportation Needs: No Transportation Needs (01/13/2024)   PRAPARE - Administrator, Civil Service (Medical): No    Lack of Transportation (Non-Medical): No  Physical Activity: Inactive (01/13/2024)   Exercise Vital Sign    Days of Exercise per Week: 0 days    Minutes of Exercise per Session: Not on file  Stress: Stress Concern Present (01/13/2024)   [employer] of Occupational Health - Occupational Stress Questionnaire    Feeling of Stress: To some extent  Social Connections: Socially Integrated (01/13/2024)   Social Connection and Isolation Panel    Frequency of Communication with Friends and Family: More than three times a week    Frequency of Social Gatherings with Friends and Family: More than three times a week    Attends Religious Services: More than 4 times per year    Active Member of [employer] or Organizations: Yes    Attends Banker Meetings: More than 4 times per year    Marital Status: Married  Catering manager Violence: Not At Risk (01/16/2024)   Humiliation, Afraid, Rape, and Kick questionnaire    Fear of Current or Ex-Partner: No    Emotionally Abused: No    Physically Abused: No    Sexually Abused: No   Family Status  Relation Name Status   Mother  Alive   Father  Alive   Sister  Alive   Daughter  Alive   Son  Alive   Oceanographer  (Not Specified)  No partnership data on file   Family History  Problem Relation Age of Onset   Breast cancer Mother 31   Hypertension Mother    Fibroids Mother    Hypertension Father    Fibroids Sister    Diabetes Paternal Aunt    No Known Allergies    Review of Systems  All other systems reviewed and are negative.     Objective:     BP 132/78 (Cuff Size: Large)   Pulse 92   Temp 97.9 F (36.6 C) (Oral)   Resp 16   Ht 5' 1 (1.549 m)   Wt 189 lb 4.8 oz (85.9 kg)   SpO2 98%   BMI 35.77 kg/m  BP Readings from Last 3  Encounters:  01/16/24 132/78  07/01/23 122/80  02/24/23 116/80   Wt Readings from Last 3 Encounters:  01/16/24 189 lb 4.8 oz (85.9 kg)  07/01/23 195 lb 3.2 oz (88.5 kg)  02/24/23 190 lb (86.2 kg)      Physical Exam Constitutional:      Appearance: Normal appearance.  HENT:     Head: Normocephalic and atraumatic.     Mouth/Throat:     Mouth: Mucous membranes are moist.  Pharynx: Oropharynx is clear.  Eyes:     Extraocular Movements: Extraocular movements intact.     Conjunctiva/sclera: Conjunctivae normal.     Pupils: Pupils are equal, round, and reactive to light.  Neck:     Comments: No thyromegaly Cardiovascular:     Rate and Rhythm: Normal rate and regular rhythm.  Pulmonary:     Effort: Pulmonary effort is normal.     Breath sounds: Normal breath sounds.  Musculoskeletal:     Cervical back: No tenderness.  Lymphadenopathy:     Cervical: No cervical adenopathy.  Skin:    General: Skin is warm and dry.  Neurological:     General: No focal deficit present.     Mental Status: She is alert. Mental status is at baseline.  Psychiatric:        Mood and Affect: Mood normal.        Behavior: Behavior normal.      No results found for any visits on 01/16/24.  Last CBC Lab Results  Component Value Date   WBC 6.4 07/06/2023   HGB 14.6 07/06/2023   HCT 43.1 07/06/2023   MCV 83.9 07/06/2023   MCH 28.4 07/06/2023   RDW 12.9 07/06/2023   PLT 288 07/06/2023   Last metabolic panel Lab Results  Component Value Date   GLUCOSE 144 (H) 07/06/2023   NA 140 07/06/2023   K 4.1 07/06/2023   CL 100 07/06/2023   CO2 33 (H) 07/06/2023   BUN 14 07/06/2023   CREATININE 0.70 07/06/2023   EGFR 97 07/06/2023   CALCIUM 9.5 07/06/2023   PHOS 4.0 06/04/2022   PROT 6.7 07/06/2023   ALBUMIN 4.3 06/04/2022   LABGLOB 2.4 06/04/2022   AGRATIO 1.8 06/04/2022   BILITOT 0.6 07/06/2023   ALKPHOS 90 06/04/2022   AST 22 07/06/2023   ALT 28 07/06/2023   Last lipids Lab Results   Component Value Date   CHOL 164 07/06/2023   HDL 48 (L) 07/06/2023   LDLCALC 90 07/06/2023   TRIG 163 (H) 07/06/2023   CHOLHDL 3.4 07/06/2023   Last hemoglobin A1c Lab Results  Component Value Date   HGBA1C 6.4 (H) 07/06/2023   Last thyroid functions Lab Results  Component Value Date   TSH 3.52 07/06/2023   T4TOTAL 7.5 06/04/2022   Last vitamin D  Lab Results  Component Value Date   VD25OH 38 07/06/2023   Last vitamin B12 and Folate Lab Results  Component Value Date   VITAMINB12 953 06/04/2022   FOLATE 7.2 06/04/2022      The 10-year ASCVD risk score (Arnett DK, et al., 2019) is: 6.8%    Assessment & Plan:   Assessment & Plan Essential hypertension Blood pressure controlled at 132/78 mmHg. Long-term hydrochlorothiazide  use without issues. Discussed dose reduction due to potential kidney effects. No high readings or hypotension symptoms. - Reduce hydrochlorothiazide  to 12.5 mg by halving 25 mg tablets. - Monitor and record home blood pressure, report in two weeks via MyChart. - Recheck blood pressure in office in 1-2 months. - Check kidney function.  Prediabetes Started metformin  in March 2025. Initial diarrhea resolved. A1c peaked at 6.4%. Discussed reducing metformin  to minimize side effects while maintaining control. - Reduce metformin  to once daily. - Order A1c test. - Monitor A1c every six months.  Hypothyroidism Levothyroxine  discontinued. No symptoms and stable thyroid function since 2021. - Order thyroid function tests.  General Health Maintenance Due for pneumonia vaccine. Shingles vaccine timing considered. Mammogram scheduled. Colonoscopy not due until 2029  unless family history changes. - Administer pneumonia vaccine today. - Order MMR titers. - Encourage scheduling shingles vaccine.  - Basic Metabolic Panel (BMET) - TSH - HgB A1c - metFORMIN  (GLUCOPHAGE ) 500 MG tablet; Take 1 tablet (500 mg total) by mouth daily with breakfast.  Dispense:  180 tablet; Refill: 0 - Measles/Mumps/Rubella Immunity - Pneumococcal conjugate vaccine 20-valent (Prevnar 20)   Return in about 4 weeks (around 02/13/2024).    Sharyle Fischer, DO

## 2024-01-16 NOTE — Progress Notes (Deleted)
 Name: Loany Neuroth   MRN: [id number]    DOB: 1960-11-18   Date:01/16/2024       Progress Note  Subjective  Chief Complaint  No chief complaint on file.   HPI  Patient presents for annual CPE.  Diet: Regular Exercise: NOne  Last Eye Exam: completed Last Dental Exam: completed   Depression: Phq 9 is  negative    12/31/2022    3:15 PM 06/08/2022    2:39 PM 04/05/2019   10:13 AM 08/21/2015    3:28 PM  Depression screen PHQ 2/9  Decreased Interest 0 0 0 0  Down, Depressed, Hopeless 0 0 0 0  PHQ - 2 Score 0 0 0 0  Altered sleeping 0 0    Tired, decreased energy 0 0    Change in appetite 0 0    Feeling bad or failure about yourself  0 0    Trouble concentrating 0 0    Moving slowly or fidgety/restless 0 0    Suicidal thoughts 0 0    PHQ-9 Score 0 0    Difficult doing work/chores Not difficult at all Not difficult at all     Hypertension: BP Readings from Last 3 Encounters:  07/01/23 122/80  02/24/23 116/80  12/31/22 128/82   Obesity: Wt Readings from Last 3 Encounters:  07/01/23 195 lb 3.2 oz (88.5 kg)  02/24/23 190 lb (86.2 kg)  12/31/22 191 lb 14.4 oz (87 kg)   BMI Readings from Last 3 Encounters:  07/01/23 25.75 kg/m  02/24/23 35.90 kg/m  12/31/22 36.26 kg/m     Vaccines: ***reviewed with the patient.   Hep C Screening: {HM options:31805} STD testing and prevention (HIV/chl/gon/syphilis):  Intimate partner violence: negative screen  Sexual History : Menstrual History/LMP/Abnormal Bleeding: Hysterectomy Discussed importance of follow up if any post-menopausal bleeding: yes  Incontinence Symptoms: negative for symptoms   Breast cancer:  - Last Mammogram: 11/15/2022 - BRCA gene screening  Osteoporosis Prevention : Discussed high calcium and vitamin D  supplementation, weight bearing exercises Bone density :no   Cervical cancer screening: not applicable due to hysterectomy  Skin cancer: Discussed monitoring for atypical lesions  Colorectal cancer:  05/20/2017 [facility]    Lung cancer:  Low Dose CT Chest recommended if Age 64-80 years, 20 pack-year currently smoking OR have quit w/in 15years. Patient does not qualify for screen   ECG: 04/05/2019  Advanced Care Planning: A voluntary discussion about advance care planning including the explanation and discussion of advance directives.  Discussed health care proxy and Living will, and the patient was able to identify a health care proxy as ***.  Patient does have a living will and power of attorney of health care   Patient Active Problem List   Diagnosis Date Noted   Mixed hyperlipidemia 07/01/2023   Vitamin D  deficiency 07/01/2023   Hypertriglyceridemia 09/21/2018   Hypothyroidism 09/21/2018   Essential hypertension 09/21/2018   Prediabetes 03/07/2018   H/O total hysterectomy 12/17/2016   Screening breast examination 08/31/2015   Routine general medical examination at a health care facility 08/31/2015   Post-menopausal atrophic vaginitis 05/28/2015   Encounter to establish care 05/26/2015    Past Surgical History:  Procedure Laterality Date   BREAST BIOPSY Left [phone]   neg   CESAREAN SECTION     TOTAL ABDOMINAL HYSTERECTOMY  05/04/2003    Family History  Problem Relation Age of Onset   Breast cancer Mother 35   Hypertension Mother    Fibroids Mother  Hypertension Father    Fibroids Sister    Diabetes Paternal Aunt     Social History   Socioeconomic History   Marital status: Married    Spouse name: Not on file   Number of children: Not on file   Years of education: Not on file   Highest education level: Bachelor's degree (e.g., BA, AB, BS)  Occupational History   Not on file  Tobacco Use   Smoking status: Never   Smokeless tobacco: Never  Vaping Use   Vaping status: Never Used  Substance and Sexual Activity   Alcohol use: No    Alcohol/week: 0.0 standard drinks of alcohol   Drug use: No   Sexual activity: Yes    Partners: Male    Birth  control/protection: Surgical    Comment: Husband   Other Topics Concern   Not on file  Social History Narrative   Married    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[employer]    2 Children    34 yo daughter and 29 yo son    Multivitamin, Fish Oil, Vitamin D     Caffeine- diet coke, tea    Social Drivers of Corporate investment banker Strain: Low Risk  (01/13/2024)   Overall Financial Resource Strain (CARDIA)    Difficulty of Paying Living Expenses: Not hard at all  Food Insecurity: No Food Insecurity (01/13/2024)   Hunger Vital Sign    Worried About Running Out of Food in the Last Year: Never true    Ran Out of Food in the Last Year: Never true  Transportation Needs: No Transportation Needs (01/13/2024)   PRAPARE - Administrator, Civil Service (Medical): No    Lack of Transportation (Non-Medical): No  Physical Activity: Inactive (01/13/2024)   Exercise Vital Sign    Days of Exercise per Week: 0 days    Minutes of Exercise per Session: Not on file  Stress: Stress Concern Present (01/13/2024)   [employer] of Occupational Health - Occupational Stress Questionnaire    Feeling of Stress: To some extent  Social Connections: Socially Integrated (01/13/2024)   Social Connection and Isolation Panel    Frequency of Communication with Friends and Family: More than three times a week    Frequency of Social Gatherings with Friends and Family: More than three times a week    Attends Religious Services: More than 4 times per year    Active Member of [employer] or Organizations: Yes    Attends Engineer, structural: More than 4 times per year    Marital Status: Married  Catering manager Violence: Not on file     Current Outpatient Medications:    cholecalciferol (VITAMIN D ) 1000 units tablet, Take 1,000 Units by mouth. Take occasionally, Disp: , Rfl:    cyanocobalamin 50 MCG tablet, Take by mouth. Take occasionally, Disp: , Rfl:    fexofenadine (ALLEGRA) 60 MG tablet, Take 60 mg  by mouth daily. Take additional tablets daily on occasion, Disp: , Rfl:    fluticasone  (FLONASE ) 50 MCG/ACT nasal spray, SPRAY 2 SPRAYS INTO EACH NOSTRIL EVERY DAY (Patient taking differently: 2 (two) times daily as needed.), Disp: 48 mL, Rfl: 3   hydrochlorothiazide  (HYDRODIURIL ) 25 MG tablet, Take 1 tablet (25 mg total) by mouth daily., Disp: 90 tablet, Rfl: 1   hydrOXYzine  (ATARAX ) 25 MG tablet, Take 25 mg by mouth every 6 (six) hours as needed., Disp: , Rfl:    ibuprofen  (ADVIL ) 800 MG tablet, Take 1  tablet (800 mg total) by mouth every 8 (eight) hours as needed. Take with food, Disp: 30 tablet, Rfl: 0   levothyroxine  (SYNTHROID ) 25 MCG tablet, TAKE 1 TABLET BY MOUTH DAILY BEFORE BREAKFAST., Disp: 90 tablet, Rfl: 1   metFORMIN  (GLUCOPHAGE ) 500 MG tablet, TAKE 1 TABLET BY MOUTH 2 TIMES DAILY WITH A MEAL., Disp: 180 tablet, Rfl: 0   triamcinolone cream (KENALOG) 0.1 %, Apply 1 Application topically. 2-3 times daily, Disp: , Rfl:   No Known Allergies   ROS  ***  Objective  There were no vitals filed for this visit.  There is no height or weight on file to calculate BMI.  Physical Exam ***  {Show previous labs (optional):23779}  Assessment & Plan  There are no diagnoses linked to this encounter.  -USPSTF grade A and B recommendations reviewed with patient; age-appropriate recommendations, preventive care, screening tests, etc discussed and encouraged; healthy living encouraged; see AVS for patient education given to patient -Discussed importance of 150 minutes of physical activity weekly, eat two servings of fish weekly, eat one serving of tree nuts ( cashews, pistachios, pecans, almonds.SABRA) every other day, eat 6 servings of fruit/vegetables daily and drink plenty of water and avoid sweet beverages.   -Reviewed Health Maintenance: {yes wn:685467}

## 2024-01-17 LAB — BASIC METABOLIC PANEL WITH GFR
BUN: 18 mg/dL (ref 7–25)
CO2: 33 mmol/L — ABNORMAL HIGH (ref 20–32)
Calcium: 9.8 mg/dL (ref 8.6–10.4)
Chloride: 100 mmol/L (ref 98–110)
Creat: 0.6 mg/dL (ref 0.50–1.05)
Glucose, Bld: 108 mg/dL — ABNORMAL HIGH (ref 65–99)
Potassium: 4 mmol/L (ref 3.5–5.3)
Sodium: 140 mmol/L (ref 135–146)
eGFR: 100 mL/min/1.73m2 (ref >=60)

## 2024-01-17 LAB — HEMOGLOBIN A1C
Hgb A1c MFr Bld: 6.2 % — ABNORMAL HIGH (ref <5.7)
Mean Plasma Glucose: 131 mg/dL
eAG (mmol/L): 7.3 mmol/L

## 2024-01-17 LAB — ADVANCED WRITTEN NOTIFICATION (AWN) TEST REFUSAL: AWN TEST REFUSED: 5259

## 2024-01-17 LAB — TSH: TSH: 1.89 m[IU]/L (ref 0.40–4.50)

## 2024-01-18 ENCOUNTER — Ambulatory Visit: Payer: Self-pay | Admitting: Internal Medicine

## 2024-02-01 ENCOUNTER — Ambulatory Visit
Admission: RE | Admit: 2024-02-01 | Discharge: 2024-02-01 | Disposition: A | Discharge status: Home / Self Care | Source: Ambulatory Visit | Attending: Internal Medicine | Admitting: Internal Medicine

## 2024-02-01 DIAGNOSIS — Z1231 Encounter for screening mammogram for malignant neoplasm of breast: Secondary | ICD-10-CM | POA: Diagnosis not present

## 2024-02-01 DIAGNOSIS — R7309 Other abnormal glucose: Secondary | ICD-10-CM | POA: Diagnosis not present

## 2024-02-06 ENCOUNTER — Other Ambulatory Visit: Payer: Self-pay | Admitting: Internal Medicine

## 2024-02-06 DIAGNOSIS — R928 Other abnormal and inconclusive findings on diagnostic imaging of breast: Secondary | ICD-10-CM

## 2024-02-10 ENCOUNTER — Ambulatory Visit
Admission: RE | Admit: 2024-02-10 | Discharge: 2024-02-10 | Disposition: A | Discharge status: Home / Self Care | Source: Ambulatory Visit | Attending: Internal Medicine | Admitting: Internal Medicine

## 2024-02-10 DIAGNOSIS — N6312 Unspecified lump in the right breast, upper inner quadrant: Secondary | ICD-10-CM | POA: Diagnosis not present

## 2024-02-10 DIAGNOSIS — R928 Other abnormal and inconclusive findings on diagnostic imaging of breast: Secondary | ICD-10-CM | POA: Diagnosis not present

## 2024-02-13 ENCOUNTER — Ambulatory Visit: Payer: Self-pay | Admitting: Internal Medicine

## 2024-03-02 ENCOUNTER — Other Ambulatory Visit: Payer: Self-pay

## 2024-03-02 ENCOUNTER — Ambulatory Visit (INDEPENDENT_AMBULATORY_CARE_PROVIDER_SITE_OTHER): Admitting: Internal Medicine

## 2024-03-02 VITALS — BP 120/82 | HR 98 | Temp 98.1°F | Resp 16 | Ht 61.0 in | Wt 188.2 lb

## 2024-03-02 DIAGNOSIS — J3489 Other specified disorders of nose and nasal sinuses: Secondary | ICD-10-CM

## 2024-03-02 DIAGNOSIS — I10 Essential (primary) hypertension: Secondary | ICD-10-CM | POA: Diagnosis not present

## 2024-03-02 NOTE — Progress Notes (Signed)
 Established Patient Office Visit  Subjective   Patient ID: Latasha Lopez, female    DOB: [birth year]  Age: 63 y.o. MRN: [id number]  Chief Complaint  Patient presents with   Hypertension    4 week recheck    Hypertension    Patient is here for follow up on chronic medical conditions.   Discussed the use of AI scribe software for clinical note transcription with the patient, who gave verbal consent to proceed.  History of Present Illness   Latasha Lopez is a 63 year old female who presents with concerns about a potential sinus infection.  She experiences sinus drainage and throat redness, which she associates with sinus infections. She has not started using her nasal spray but has it available. Vertigo sometimes occurs with sinus issues. She takes Allegra daily for allergies. There is no fever or significant worsening of symptoms.  Her hypertension is managed by reducing her hydrochlorothiazide  dose by half, with stable blood pressure readings around 120/82 mmHg. No dizziness is related to the medication adjustment.  Her A1c level is 6.2, indicating prediabetes, and she is taking metformin  for blood sugar management.  She has a history of thyroid issues but is no longer on thyroid medication. Her recent TSH level is 1.8.  She has a family history of kidney issues and colon polyps. Her recent kidney function tests were normal. Her last colon cancer screening was in 2019, with screenings advised every ten years unless polyps are found.    Patient Active Problem List   Diagnosis Date Noted   Mixed hyperlipidemia 07/01/2023   Vitamin D  deficiency 07/01/2023   Hypertriglyceridemia 09/21/2018   Hypothyroidism 09/21/2018   Essential hypertension 09/21/2018   Prediabetes 03/07/2018   H/O total hysterectomy 12/17/2016   Screening breast examination 08/31/2015   Routine general medical examination at a health care facility 08/31/2015   Post-menopausal atrophic vaginitis  05/28/2015   Encounter to establish care 05/26/2015   Past Medical History:  Diagnosis Date   Allergy    History of chicken pox    Hypertension    Pre-diabetes    Thyroid disease    Past Surgical History:  Procedure Laterality Date   BREAST BIOPSY Left [id number]   neg   CESAREAN SECTION     TOTAL ABDOMINAL HYSTERECTOMY  05/04/2003   Social History   Tobacco Use   Smoking status: Never   Smokeless tobacco: Never  Vaping Use   Vaping status: Never Used  Substance Use Topics   Alcohol use: No    Alcohol/week: 0.0 standard drinks of alcohol   Drug use: No   Social History   Socioeconomic History   Marital status: Married    Spouse name: Not on file   Number of children: Not on file   Years of education: Not on file   Highest education level: Bachelor's degree (e.g., BA, AB, BS)  Occupational History   Not on file  Tobacco Use   Smoking status: Never   Smokeless tobacco: Never  Vaping Use   Vaping status: Never Used  Substance and Sexual Activity   Alcohol use: No    Alcohol/week: 0.0 standard drinks of alcohol   Drug use: No   Sexual activity: Yes    Partners: Male    Birth control/protection: Surgical    Comment: Husband   Other Topics Concern   Not on file  Social History Narrative   Married    Insurance Claims Handler work at [employer]  2 Children    32 yo daughter and 27 yo son    Multivitamin, Fish Oil, Vitamin D     Caffeine- diet coke, tea    Social Drivers of Corporate Investment Banker Strain: Low Risk  (03/02/2024)   Overall Financial Resource Strain (CARDIA)    Difficulty of Paying Living Expenses: Not hard at all  Food Insecurity: No Food Insecurity (03/02/2024)   Hunger Vital Sign    Worried About Running Out of Food in the Last Year: Never true    Ran Out of Food in the Last Year: Never true  Transportation Needs: No Transportation Needs (03/02/2024)   PRAPARE - Administrator, Civil Service (Medical): No    Lack of  Transportation (Non-Medical): No  Physical Activity: Inactive (03/02/2024)   Exercise Vital Sign    Days of Exercise per Week: 0 days    Minutes of Exercise per Session: Not on file  Stress: Stress Concern Present (03/02/2024)   [employer] of Occupational Health - Occupational Stress Questionnaire    Feeling of Stress: To some extent  Social Connections: Socially Integrated (03/02/2024)   Social Connection and Isolation Panel    Frequency of Communication with Friends and Family: More than three times a week    Frequency of Social Gatherings with Friends and Family: Three times a week    Attends Religious Services: 1 to 4 times per year    Active Member of Clubs or Organizations: Yes    Attends Banker Meetings: 1 to 4 times per year    Marital Status: Married  Catering Manager Violence: Not At Risk (01/16/2024)   Humiliation, Afraid, Rape, and Kick questionnaire    Fear of Current or Ex-Partner: No    Emotionally Abused: No    Physically Abused: No    Sexually Abused: No   Family Status  Relation Name Status   Mother  Alive   Father  Alive   Sister  Alive   Daughter  Alive   Son  Alive   Oceanographer  (Not Specified)  No partnership data on file   Family History  Problem Relation Age of Onset   Breast cancer Mother 66   Hypertension Mother    Fibroids Mother    Hypertension Father    Fibroids Sister    Diabetes Paternal Aunt    No Known Allergies    Review of Systems  Constitutional:  Negative for chills and fever.  HENT:  Positive for sore throat.       Objective:     BP 120/82 (Cuff Size: Large)   Pulse 98   Temp 98.1 F (36.7 C) (Oral)   Resp 16   Ht 5' 1 (1.549 m)   Wt 188 lb 3.2 oz (85.4 kg)   SpO2 98%   BMI 35.56 kg/m  BP Readings from Last 3 Encounters:  03/02/24 120/82  01/16/24 132/78  07/01/23 122/80   Wt Readings from Last 3 Encounters:  03/02/24 188 lb 3.2 oz (85.4 kg)  01/16/24 189 lb 4.8 oz (85.9 kg)  07/01/23 195  lb 3.2 oz (88.5 kg)      Physical Exam Constitutional:      Appearance: Normal appearance.  HENT:     Head: Normocephalic and atraumatic.     Right Ear: Tympanic membrane, ear canal and external ear normal.     Left Ear: Tympanic membrane, ear canal and external ear normal.     Nose: Nose normal.  Mouth/Throat:     Mouth: Mucous membranes are moist.     Pharynx: Posterior oropharyngeal erythema present.  Eyes:     Conjunctiva/sclera: Conjunctivae normal.  Cardiovascular:     Rate and Rhythm: Normal rate and regular rhythm.  Pulmonary:     Effort: Pulmonary effort is normal.     Breath sounds: Normal breath sounds.  Skin:    General: Skin is warm and dry.  Neurological:     General: No focal deficit present.     Mental Status: She is alert. Mental status is at baseline.  Psychiatric:        Mood and Affect: Mood normal.        Behavior: Behavior normal.      No results found for any visits on 03/02/24.  Last CBC Lab Results  Component Value Date   WBC 6.4 07/06/2023   HGB 14.6 07/06/2023   HCT 43.1 07/06/2023   MCV 83.9 07/06/2023   MCH 28.4 07/06/2023   RDW 12.9 07/06/2023   PLT 288 07/06/2023   Last metabolic panel Lab Results  Component Value Date   GLUCOSE 108 (H) 01/16/2024   NA 140 01/16/2024   K 4.0 01/16/2024   CL 100 01/16/2024   CO2 33 (H) 01/16/2024   BUN 18 01/16/2024   CREATININE 0.60 01/16/2024   EGFR 100 01/16/2024   CALCIUM 9.8 01/16/2024   PHOS 4.0 06/04/2022   PROT 6.7 07/06/2023   ALBUMIN 4.3 06/04/2022   LABGLOB 2.4 06/04/2022   AGRATIO 1.8 06/04/2022   BILITOT 0.6 07/06/2023   ALKPHOS 90 06/04/2022   AST 22 07/06/2023   ALT 28 07/06/2023   Last lipids Lab Results  Component Value Date   CHOL 164 07/06/2023   HDL 48 (L) 07/06/2023   LDLCALC 90 07/06/2023   TRIG 163 (H) 07/06/2023   CHOLHDL 3.4 07/06/2023   Last hemoglobin A1c Lab Results  Component Value Date   HGBA1C 6.2 (H) 01/16/2024   Last thyroid  functions Lab Results  Component Value Date   TSH 1.89 01/16/2024   T4TOTAL 7.5 06/04/2022   Last vitamin D  Lab Results  Component Value Date   VD25OH 38 07/06/2023   Last vitamin B12 and Folate Lab Results  Component Value Date   VITAMINB12 953 06/04/2022   FOLATE 7.2 06/04/2022      The 10-year ASCVD risk score (Arnett DK, et al., 2019) is: 5.6%    Assessment & Plan:   Assessment & Plan  Essential hypertension Blood pressure controlled with reduced hydrochlorothiazide  dosage. Kidney function and electrolytes normal. - Update prescription for hydrochlorothiazide  to 12.5 mg.  Prediabetes A1c improved to 6.2%, managed with metformin . - Continue metformin . - Recheck A1c in six months.  Acute sinusitis Patient experiencing drainage and sinus issues, symptoms attributed to allergies and possible early stage of sinusitis, which may be manageable with nasal sprays. - Use saline nasal spray to rinse sinuses. - Use Flonase  nasal spray, two sprays each side twice daily. - Contact provider if symptoms worsen or vertigo develops. - Consider oral steroids if symptoms worsen. - Consider antibiotics if symptoms worsen and are not resolved after 10 days. - Increase intake of vitamin C and zinc. - Continue Allegra for allergy management.  General Health Maintenance Up to date with mammogram. Colon cancer screening last done in 2019, due for repeat per 10-year schedule and family history of polyps. Discussed shingles vaccination timing. - Consider scheduling shingles vaccination in January.  Follow-Up Scheduled for follow-up in six months  for blood pressure and A1c monitoring. Full labs not required until fall. - Schedule follow-up appointment in six months for blood pressure and A1c monitoring.  - hydrochlorothiazide  (MICROZIDE ) 12.5 MG capsule; Take 1 capsule (12.5 mg total) by mouth daily.   Return in about 6 months (around 08/30/2024).    Sharyle Fischer, DO

## 2024-03-03 DIAGNOSIS — R7309 Other abnormal glucose: Secondary | ICD-10-CM | POA: Diagnosis not present

## 2024-04-04 ENCOUNTER — Other Ambulatory Visit: Payer: Self-pay | Admitting: Internal Medicine

## 2024-04-04 DIAGNOSIS — R7303 Prediabetes: Secondary | ICD-10-CM

## 2024-04-04 DIAGNOSIS — I10 Essential (primary) hypertension: Secondary | ICD-10-CM

## 2024-04-06 NOTE — Telephone Encounter (Signed)
 Requested Prescriptions  Pending Prescriptions Disp Refills   hydrochlorothiazide  (HYDRODIURIL ) 25 MG tablet [Pharmacy Med Name: HYDROCHLOROTHIAZIDE  25 MG TAB] 90 tablet 1    Sig: TAKE 1 TABLET (25 MG TOTAL) BY MOUTH DAILY.     Cardiovascular: Diuretics - Thiazide Passed - 04/06/2024  1:45 PM      Passed - Cr in normal range and within 180 days    Creat  Date Value Ref Range Status  01/16/2024 0.60 0.50 - 1.05 mg/dL Final         Passed - K in normal range and within 180 days    Potassium  Date Value Ref Range Status  01/16/2024 4.0 3.5 - 5.3 mmol/L Final         Passed - Na in normal range and within 180 days    Sodium  Date Value Ref Range Status  01/16/2024 140 135 - 146 mmol/L Final  11/10/2022 145 (H) 134 - 144 mmol/L Final         Passed - Last BP in normal range    BP Readings from Last 1 Encounters:  03/02/24 120/82         Passed - Valid encounter within last 6 months    Recent Outpatient Visits           1 month ago Essential hypertension   [facility]end[facility]   2 months ago Essential hypertension   [facility]ardo Fend, DO   9 months ago Essential hypertension   [facility], [facility]               metFORMIN  (GLUCOPHAGE ) 500 MG tablet [Pharmacy Med Name: METFORMIN  HCL 500 MG TABLET] 180 tablet 0    Sig: TAKE 1 TABLET BY MOUTH 2 TIMES DAILY WITH A MEAL.     Endocrinology:  Diabetes - Biguanides Passed - 04/06/2024  1:45 PM      Passed - Cr in normal range and within 360 days    Creat  Date Value Ref Range Status  01/16/2024 0.60 0.50 - 1.05 mg/dL Final         Passed - HBA1C is between 0 and 7.9 and within 180 days    Hemoglobin A1C  Date Value Ref Range Status  08/15/2015 6.2  Final   Hgb A1c MFr Bld  Date Value Ref Range Status  01/16/2024 6.2 (H) <5.7 % Final    Comment:    For someone without known diabetes, a hemoglobin   A1c value between 5.7% and 6.4% is consistent with prediabetes and should be confirmed with a  follow-up test. . For someone with known diabetes, a value <7% indicates that their diabetes is well controlled. A1c targets should be individualized based on duration of diabetes, age, comorbid conditions, and other considerations. . This assay result is consistent with an increased risk of diabetes. . Currently, no consensus exists regarding use of hemoglobin A1c for diagnosis of diabetes for children. .          Passed - eGFR in normal range and within 360 days    GFR calc Af Amer  Date Value Ref Range Status  09/27/2019 93 >59 mL/min/1.73 Final    Comment:    **[facility] currently reports eGFR in compliance with the current**   recommendations of the [employer]. [facility] will   update reporting as new guidelines are published from the NKF-ASN   Task force.  GFR calc non Af Amer  Date Value Ref Range Status  09/27/2019 80 >59 mL/min/1.73 Final   eGFR  Date Value Ref Range Status  01/16/2024 100 > OR = 60 mL/min/1.34m2 Final  11/10/2022 85 >59 mL/min/1.73 Final         Passed - B12 Level in normal range and within 720 days    Vitamin B-12  Date Value Ref Range Status  06/04/2022 953 232 - 1,245 pg/mL Final         Passed - Valid encounter within last 6 months    Recent Outpatient Visits           1 month ago Essential hypertension   [place] [facility] Bernardo Fend, DO   2 months ago Essential hypertension   [place] [facility] Bernardo Fend, DO   9 months ago Essential hypertension   [place] [facility] Bernardo Fend, [place]              Passed - CBC within normal limits and completed in the last 12 months    WBC  Date Value Ref Range Status  07/06/2023 6.4 3.8 - 10.8 Thousand/uL Final   RBC  Date Value Ref Range Status  07/06/2023 5.14 (H) 3.80 - 5.10 Million/uL  Final   Hemoglobin  Date Value Ref Range Status  07/06/2023 14.6 11.7 - 15.5 g/dL Final  97/97/7975 87.4 11.1 - 15.9 g/dL Final   HCT  Date Value Ref Range Status  07/06/2023 43.1 35.0 - 45.0 % Final   Hematocrit  Date Value Ref Range Status  06/04/2022 37.6 34.0 - 46.6 % Final   MCHC  Date Value Ref Range Status  07/06/2023 33.9 32.0 - 36.0 g/dL Final    Comment:    For adults, a slight decrease in the calculated MCHC value (in the range of 30 to 32 g/dL) is most likely not clinically significant; however, it should be interpreted with caution in correlation with other red cell parameters and the patient's clinical condition.    [facility]  Date Value Ref Range Status  07/06/2023 28.4 27.0 - 33.0 pg Final   MCV  Date Value Ref Range Status  07/06/2023 83.9 80.0 - 100.0 fL Final  06/04/2022 80 79 - 97 fL Final   No results found for: PLTCOUNTKUC, LABPLAT, POCPLA RDW  Date Value Ref Range Status  07/06/2023 12.9 11.0 - 15.0 % Final  06/04/2022 12.9 11.7 - 15.4 % Final

## 2024-08-30 ENCOUNTER — Ambulatory Visit: Admitting: Internal Medicine
